# Patient Record
Sex: Female | Born: 1990 | Race: White | Hispanic: No | Marital: Single | Smoking: Former smoker
Health system: Southern US, Community
[De-identification: ages and names within clinical notes are randomized; demographics above are authoritative.]

## PROBLEM LIST (undated history)

## (undated) DIAGNOSIS — M1990 Unspecified osteoarthritis, unspecified site: Secondary | ICD-10-CM

## (undated) DIAGNOSIS — T7840XA Allergy, unspecified, initial encounter: Secondary | ICD-10-CM

## (undated) DIAGNOSIS — G4730 Sleep apnea, unspecified: Secondary | ICD-10-CM

## (undated) DIAGNOSIS — J45909 Unspecified asthma, uncomplicated: Secondary | ICD-10-CM

## (undated) DIAGNOSIS — F419 Anxiety disorder, unspecified: Secondary | ICD-10-CM

## (undated) DIAGNOSIS — F4310 Post-traumatic stress disorder, unspecified: Secondary | ICD-10-CM

## (undated) DIAGNOSIS — E039 Hypothyroidism, unspecified: Secondary | ICD-10-CM

## (undated) DIAGNOSIS — M419 Scoliosis, unspecified: Secondary | ICD-10-CM

## (undated) DIAGNOSIS — E662 Morbid (severe) obesity with alveolar hypoventilation: Secondary | ICD-10-CM

## (undated) DIAGNOSIS — F32A Depression, unspecified: Secondary | ICD-10-CM

## (undated) DIAGNOSIS — G4733 Obstructive sleep apnea (adult) (pediatric): Secondary | ICD-10-CM

## (undated) DIAGNOSIS — I517 Cardiomegaly: Secondary | ICD-10-CM

## (undated) DIAGNOSIS — F319 Bipolar disorder, unspecified: Secondary | ICD-10-CM

## (undated) DIAGNOSIS — R400 Somnolence: Secondary | ICD-10-CM

## (undated) DIAGNOSIS — Z21 Asymptomatic human immunodeficiency virus [HIV] infection status: Secondary | ICD-10-CM

## (undated) DIAGNOSIS — B20 Human immunodeficiency virus [HIV] disease: Secondary | ICD-10-CM

## (undated) HISTORY — DX: Depression, unspecified: F32.A

## (undated) HISTORY — DX: Scoliosis, unspecified: M41.9

## (undated) HISTORY — DX: Morbid (severe) obesity with alveolar hypoventilation: E66.2

## (undated) HISTORY — DX: Cardiomegaly: I51.7

## (undated) HISTORY — DX: Obstructive sleep apnea (adult) (pediatric): G47.33

## (undated) HISTORY — DX: Anxiety disorder, unspecified: F41.9

## (undated) HISTORY — DX: Sleep apnea, unspecified: G47.30

## (undated) HISTORY — DX: Human immunodeficiency virus (HIV) disease: B20

## (undated) HISTORY — DX: Somnolence: R40.0

## (undated) HISTORY — PX: CHOLECYSTECTOMY: SHX55

## (undated) HISTORY — DX: Allergy, unspecified, initial encounter: T78.40XA

## (undated) HISTORY — DX: Asymptomatic human immunodeficiency virus (hiv) infection status: Z21

## (undated) HISTORY — PX: NO PAST SURGERIES: SHX2092

---

## 2011-01-04 ENCOUNTER — Emergency Department: Payer: Self-pay | Admitting: Emergency Medicine

## 2012-07-24 ENCOUNTER — Emergency Department: Payer: Self-pay | Admitting: Emergency Medicine

## 2012-09-01 ENCOUNTER — Emergency Department: Payer: Self-pay | Admitting: Emergency Medicine

## 2012-09-01 LAB — COMPREHENSIVE METABOLIC PANEL
Anion Gap: 7 (ref 7–16)
BUN: 13 mg/dL (ref 7–18)
Chloride: 107 mmol/L (ref 98–107)
EGFR (African American): >60
Glucose: 81 mg/dL (ref 65–99)
Potassium: 4.7 mmol/L (ref 3.5–5.1)
SGOT(AST): 41 U/L — ABNORMAL HIGH (ref 15–37)
Sodium: 141 mmol/L (ref 136–145)

## 2012-09-01 LAB — URINALYSIS, COMPLETE
Blood: NEGATIVE
Leukocyte Esterase: NEGATIVE
Ph: 5 (ref 4.5–8.0)
Protein: NEGATIVE
RBC,UR: <1 /HPF (ref 0–5)
Squamous Epithelial: 2

## 2012-09-01 LAB — CBC
HCT: 44.2 % (ref 35.0–47.0)
MCH: 31.5 pg (ref 26.0–34.0)
MCHC: 34.5 g/dL (ref 32.0–36.0)
MCV: 91 fL (ref 80–100)
Platelet: 276 10*3/uL (ref 150–440)
RDW: 12.8 % (ref 11.5–14.5)

## 2012-09-30 ENCOUNTER — Ambulatory Visit: Payer: Self-pay

## 2017-01-03 DIAGNOSIS — F39 Unspecified mood [affective] disorder: Secondary | ICD-10-CM

## 2017-01-03 HISTORY — DX: Unspecified mood (affective) disorder: F39

## 2019-01-20 ENCOUNTER — Other Ambulatory Visit: Payer: Self-pay

## 2019-01-20 ENCOUNTER — Emergency Department (HOSPITAL_COMMUNITY): Payer: Self-pay

## 2019-01-20 ENCOUNTER — Encounter (HOSPITAL_COMMUNITY): Payer: Self-pay

## 2019-01-20 ENCOUNTER — Emergency Department (HOSPITAL_COMMUNITY)
Admission: EM | Admit: 2019-01-20 | Discharge: 2019-01-20 | Disposition: A | Discharge status: Home / Self Care | Payer: Self-pay | Attending: Emergency Medicine | Admitting: Emergency Medicine

## 2019-01-20 DIAGNOSIS — J40 Bronchitis, not specified as acute or chronic: Secondary | ICD-10-CM | POA: Insufficient documentation

## 2019-01-20 DIAGNOSIS — R05 Cough: Secondary | ICD-10-CM

## 2019-01-20 DIAGNOSIS — R059 Cough, unspecified: Secondary | ICD-10-CM

## 2019-01-20 HISTORY — DX: Post-traumatic stress disorder, unspecified: F43.10

## 2019-01-20 HISTORY — DX: Unspecified osteoarthritis, unspecified site: M19.90

## 2019-01-20 HISTORY — DX: Unspecified asthma, uncomplicated: J45.909

## 2019-01-20 LAB — POC URINE PREG, ED: Preg Test, Ur: NEGATIVE

## 2019-01-20 LAB — GROUP A STREP BY PCR: Group A Strep by PCR: NOT DETECTED

## 2019-01-20 MED ORDER — ALBUTEROL SULFATE (2.5 MG/3ML) 0.083% IN NEBU
5.0000 mg | INHALATION_SOLUTION | Freq: Once | RESPIRATORY_TRACT | Status: AC
  Administered 2019-01-20: 5 mg via RESPIRATORY_TRACT
  Filled 2019-01-20: qty 6

## 2019-01-20 MED ORDER — PREDNISONE 20 MG PO TABS: ORAL_TABLET | ORAL | 0 refills | Status: DC

## 2019-01-20 MED ORDER — BENZONATATE 100 MG PO CAPS: 100.0000 mg | ORAL_CAPSULE | Freq: Three times a day (TID) | ORAL | 0 refills | Status: DC

## 2019-01-20 NOTE — ED Notes (Signed)
Patient verbalizes understanding of medications and discharge instructions. No further questions at this time. VSS and patient ambulatory at discharge.   

## 2019-01-20 NOTE — ED Triage Notes (Signed)
Pt here with a sore throat and cough for the last 3 days. Kids at home tested positive for the flu and strep throat.  A&Ox4, afebrile.

## 2019-01-20 NOTE — ED Provider Notes (Signed)
[facility] EMERGENCY DEPARTMENT Provider Note   CSN: [id number] Arrival date & time: 01/20/19  0038    History   Chief Complaint Chief Complaint  Patient presents with  . Cough  . Sore Throat    HPI Brenda Sawyer is a 28 y.o. female.     The history is provided by the patient and medical records.  Cough  Associated symptoms: fever, sore throat and wheezing   Sore Throat     28 y.o. F with hx of asthma, arthritis, PTSD, presenting to the ED for sore throat, cough, and flu like symptoms.  States she has been sick for 3 days.  She reports productive cough with green phlegm, wheezing, body aches, nasal congestion, and sore throat.  Also reports fevers, afebrile here.  States children have been sick with flu and strep.  Has taken OTC cold meds without relief.  Patient also had a boil on left medial thigh that she popped a week ago.  States it feels sore and would like it checked.  No bleeding or drainage currently.  No hx of MRSA or DM.  Past Medical History:  Diagnosis Date  . Arthritis   . Asthma   . PTSD (post-traumatic stress disorder)     There are no active problems to display for this patient.   History reviewed. No pertinent surgical history.   OB History   No obstetric history on file.      Home Medications    Prior to Admission medications   Not on File    Family History History reviewed. No pertinent family history.  Social History Social History   Tobacco Use  . Smoking status: Never Smoker  . Smokeless tobacco: Never Used  Substance Use Topics  . Alcohol use: Never    Frequency: Never  . Drug use: Never     Allergies   Patient has no known allergies.   Review of Systems Review of Systems  Constitutional: Positive for fever.  HENT: Positive for congestion and sore throat.   Respiratory: Positive for cough and wheezing.   Skin: Positive for wound (boil).  All other systems reviewed and are negative.    Physical  Exam Updated Vital Signs BP 123/72 (BP Location: Right Arm)   Pulse 73   Temp 98.3 F (36.8 C) (Oral)   Resp 18   SpO2 97%   Physical Exam Vitals signs and nursing note reviewed.  Constitutional:      Appearance: She is well-developed.  HENT:     Head: Normocephalic and atraumatic.     Right Ear: Tympanic membrane and ear canal normal.     Left Ear: Tympanic membrane and ear canal normal.     Nose: Congestion present.     Mouth/Throat:     Lips: Pink.     Mouth: Mucous membranes are moist.     Pharynx: Oropharynx is clear.     Comments: PND; Tonsils overall normal in appearance bilaterally without exudate; uvula midline without evidence of peritonsillar abscess; handling secretions appropriately; no difficulty swallowing or speaking; normal phonation without stridor Eyes:     Conjunctiva/sclera: Conjunctivae normal.     Pupils: Pupils are equal, round, and reactive to light.  Neck:     Musculoskeletal: Normal range of motion.  Cardiovascular:     Rate and Rhythm: Normal rate and regular rhythm.     Heart sounds: Normal heart sounds.  Pulmonary:     Effort: Pulmonary effort is normal.  Breath sounds: Wheezing present.     Comments: Diffuse expiratory wheezes, dry cough witnessed, no acute distress Abdominal:     General: Bowel sounds are normal.     Palpations: Abdomen is soft.  Musculoskeletal: Normal range of motion.     Comments: Scabbed area noted to left medial thigh; no signs of superimposed infection or cellulitis; no streaking of the leg; no tissue crepitus  Skin:    General: Skin is warm and dry.  Neurological:     Mental Status: She is alert and oriented to person, place, and time.      ED Treatments / Results  Labs (all labs ordered are listed, but only abnormal results are displayed) Labs Reviewed  GROUP A STREP BY PCR  POC URINE PREG, ED    EKG None  Radiology Dg Chest 2 View  Result Date: 01/20/2019 CLINICAL DATA:  Cough and fever EXAM:  CHEST - 2 VIEW COMPARISON:  None. FINDINGS: Central airway thickening. There is no edema, consolidation, effusion, or pneumothorax. Normal heart size and mediastinal contours. IMPRESSION: Probable bronchitic airway thickening.  Negative for pneumonia. Electronically Signed   By: Marnee Spring M.D.   On: 01/20/2019 06:24    Procedures Procedures (including critical care time)  Medications Ordered in ED Medications  albuterol (PROVENTIL) (2.5 MG/3ML) 0.083% nebulizer solution 5 mg (5 mg Nebulization Given 01/20/19 0446)     Initial Impression / Assessment and Plan / ED Course  I have reviewed the triage vital signs and the nursing notes.  Pertinent labs & imaging results that were available during my care of the patient were reviewed by me and considered in my medical decision making (see chart for details).  28 y.o. F here with sore throat and flu like symptoms for 3 days.  Kids have been sick with sore throat and flu.  She is afebrile, non-toxic.  Exam with nasal congestion and PND but no tonsillar edema or exudates.  Lungs with some wheezes noted but no acute distress.  Rapid strep negative.  CXR with some bronchitic changes but no acute infiltrate.  Feel this is likely viral process.  Will start prednisone taper, tessalon for cough.  Continue albuterol inhaler PRN.    Also with boil of left medial thigh.  She popped this last week but feels sore.  Scab noted at area of concern, but no signs of acute abscess or cellulitis.  Warm compresses.  Close follow-up with PCP.  Return here for any new/acute changes.  Final Clinical Impressions(s) / ED Diagnoses   Final diagnoses:  Bronchitis  Cough    ED Discharge Orders         Ordered    benzonatate (TESSALON) 100 MG capsule  Every 8 hours     01/20/19 0644    predniSONE (DELTASONE) 20 MG tablet     01/20/19 0644           Garlon Hatchet, PA-C 01/20/19 0651    Ward, Layla Maw, DO 01/20/19 0710

## 2019-01-20 NOTE — Discharge Instructions (Signed)
Take the prescribed medication as directed.  Can use albuterol. Follow-up with your primary care doctor. Return to the ED for new or worsening symptoms.

## 2019-05-20 ENCOUNTER — Other Ambulatory Visit: Payer: Self-pay

## 2019-05-20 ENCOUNTER — Encounter (HOSPITAL_COMMUNITY): Payer: Self-pay | Admitting: *Deleted

## 2019-05-20 ENCOUNTER — Inpatient Hospital Stay (HOSPITAL_COMMUNITY)
Admission: EM | Admit: 2019-05-20 | Discharge: 2019-05-20 | Disposition: A | Discharge status: Home / Self Care | Payer: Medicaid Other | Attending: Obstetrics and Gynecology | Admitting: Obstetrics and Gynecology

## 2019-05-20 ENCOUNTER — Inpatient Hospital Stay (HOSPITAL_COMMUNITY): Payer: Medicaid Other

## 2019-05-20 DIAGNOSIS — R109 Unspecified abdominal pain: Secondary | ICD-10-CM | POA: Insufficient documentation

## 2019-05-20 DIAGNOSIS — O26891 Other specified pregnancy related conditions, first trimester: Secondary | ICD-10-CM | POA: Diagnosis not present

## 2019-05-20 DIAGNOSIS — R197 Diarrhea, unspecified: Secondary | ICD-10-CM

## 2019-05-20 DIAGNOSIS — O3680X0 Pregnancy with inconclusive fetal viability, not applicable or unspecified: Secondary | ICD-10-CM

## 2019-05-20 DIAGNOSIS — Z6790 Unspecified blood type, Rh positive: Secondary | ICD-10-CM

## 2019-05-20 DIAGNOSIS — O9989 Other specified diseases and conditions complicating pregnancy, childbirth and the puerperium: Secondary | ICD-10-CM | POA: Insufficient documentation

## 2019-05-20 DIAGNOSIS — R102 Pelvic and perineal pain: Secondary | ICD-10-CM | POA: Insufficient documentation

## 2019-05-20 DIAGNOSIS — Z3A01 Less than 8 weeks gestation of pregnancy: Secondary | ICD-10-CM | POA: Diagnosis not present

## 2019-05-20 DIAGNOSIS — O26899 Other specified pregnancy related conditions, unspecified trimester: Secondary | ICD-10-CM

## 2019-05-20 HISTORY — DX: Hypothyroidism, unspecified: E03.9

## 2019-05-20 LAB — CBC
HCT: 41.4 % (ref 36.0–46.0)
Hemoglobin: 14.4 g/dL (ref 12.0–15.0)
MCH: 31.3 pg (ref 26.0–34.0)
MCHC: 34.8 g/dL (ref 30.0–36.0)
MCV: 90 fL (ref 80.0–100.0)
Platelets: 265 10*3/uL (ref 150–400)
RBC: 4.6 MIL/uL (ref 3.87–5.11)
RDW: 12 % (ref 11.5–15.5)
WBC: 10.2 10*3/uL (ref 4.0–10.5)
nRBC: 0 % (ref 0.0–0.2)

## 2019-05-20 LAB — WET PREP, GENITAL
Clue Cells Wet Prep HPF POC: NONE SEEN
Sperm: NONE SEEN
Trich, Wet Prep: NONE SEEN
Yeast Wet Prep HPF POC: NONE SEEN

## 2019-05-20 LAB — URINALYSIS, ROUTINE W REFLEX MICROSCOPIC
Bilirubin Urine: NEGATIVE
Glucose, UA: NEGATIVE mg/dL
Hgb urine dipstick: NEGATIVE
Ketones, ur: NEGATIVE mg/dL
Leukocytes,Ua: NEGATIVE
Nitrite: NEGATIVE
Protein, ur: NEGATIVE mg/dL
Specific Gravity, Urine: 1.02 (ref 1.005–1.030)
pH: 6 (ref 5.0–8.0)

## 2019-05-20 LAB — COMPREHENSIVE METABOLIC PANEL
ALT: 38 U/L (ref 0–44)
AST: 35 U/L (ref 15–41)
Albumin: 3.5 g/dL (ref 3.5–5.0)
Alkaline Phosphatase: 53 U/L (ref 38–126)
Anion gap: 10 (ref 5–15)
BUN: 11 mg/dL (ref 6–20)
CO2: 23 mmol/L (ref 22–32)
Calcium: 9.1 mg/dL (ref 8.9–10.3)
Chloride: 103 mmol/L (ref 98–111)
Creatinine, Ser: 0.85 mg/dL (ref 0.44–1.00)
GFR calc Af Amer: >60 mL/min (ref >60)
GFR calc non Af Amer: >60 mL/min (ref >60)
Glucose, Bld: 92 mg/dL (ref 70–99)
Potassium: 3.8 mmol/L (ref 3.5–5.1)
Sodium: 136 mmol/L (ref 135–145)
Total Bilirubin: 0.7 mg/dL (ref 0.3–1.2)
Total Protein: 7 g/dL (ref 6.5–8.1)

## 2019-05-20 LAB — ABO/RH: ABO/RH(D): B POS

## 2019-05-20 LAB — HCG, QUANTITATIVE, PREGNANCY: hCG, Beta Chain, Quant, S: 157 m[IU]/mL — ABNORMAL HIGH (ref <5)

## 2019-05-20 LAB — POCT PREGNANCY, URINE: Preg Test, Ur: POSITIVE — AB

## 2019-05-20 NOTE — MAU Note (Signed)
+  HPT x6.  Having cramping in lower abd, started 3 days ago.  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  Her place of employment has been tested, but they have not gotten the results yet.  Pt states is having diarrhea, started 3 days ago as loose and watery, now is just soft. (6/in the last 24 hrs)

## 2019-05-20 NOTE — MAU Provider Note (Signed)
History     CSN: [id number]  Arrival date and time: 05/20/19 3500   First Provider Initiated Contact with Patient 05/20/19 1044      Chief Complaint  Patient presents with  . Abdominal Pain  . Possible Pregnancy  . Diarrhea   Ms. Brenda Sawyer is a 28 y.o. G1P0 at [redacted]w[redacted]d who presents to MAU for abdominal cramping. 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 Pt reports she has had diarrhea x3days, stools were loose/watery, now are formed, but soft. Pt reports diarrhea is improving. Pt has gone to bathroom 6times in past 24hrs.  Onset: 3days Location: lower abdomen/pelvis Duration: 3days Character: intermittent, sharp, stabbing Aggravating/Associated: none/slight fish-like odor to vaginal discharge Relieving: none Treatment: Tylenol - helped relieve pain  Pt denies VB, vaginal discharge/itching. Pt denies N/V, constipation, or urinary problems. Pt denies fever, chills, fatigue, sweating or changes in appetite. Pt denies SOB or chest pain. Pt denies dizziness, HA, light-headedness, weakness.  Problems this pregnancy include: pt has not yet been seen. Allergies? Sulfa ABX, artificial cinnamon Current medications/supplements? Levothyroxine, PNVs   OB History    Gravida  1   Para      Term      Preterm      AB      Living        SAB      TAB      Ectopic      Multiple      Live Births              Past Medical History:  Diagnosis Date  . Asthma   . Hypothyroidism   . PTSD (post-traumatic stress disorder)     History reviewed. No pertinent surgical history.  History reviewed. No pertinent family history.  Social History   Tobacco Use  . Smoking status: Never Smoker  . Smokeless tobacco: Never Used  Substance Use Topics  . Alcohol use: Never    Frequency: Never  . Drug use: Never    Allergies:  Allergies  Allergen Reactions  . Sulfa Antibiotics Hives    Medications Prior to Admission  Medication  Sig Dispense Refill Last Dose  . levofloxacin (LEVAQUIN) 25 MG/ML solution Take 137 mg by mouth daily.   05/20/2019 at Unknown time  . Prenatal Vit-Fe Fumarate-FA (PRENATAL MULTIVITAMIN) TABS tablet Take 1 tablet by mouth daily at 12 noon.   05/20/2019 at Unknown time  . benzonatate (TESSALON) 100 MG capsule Take 1 capsule (100 mg total) by mouth every 8 (eight) hours. 21 capsule 0   . predniSONE (DELTASONE) 20 MG tablet Take 40 mg by mouth daily for 3 days, then 20mg  by mouth daily for 3 days, then 10mg  daily for 3 days 12 tablet 0     Review of Systems  Constitutional: Negative for chills, diaphoresis, fatigue and fever.  Respiratory: Negative for shortness of breath.   Cardiovascular: Negative for chest pain.  Gastrointestinal: Positive for abdominal pain and diarrhea. Negative for constipation, nausea and vomiting.  Genitourinary: Positive for pelvic pain. Negative for dysuria, flank pain, frequency, urgency, vaginal bleeding and vaginal discharge.  Neurological: Negative for dizziness, weakness, light-headedness and headaches.   Physical Exam   Blood pressure 123/82, pulse 76, temperature 98.1 F (36.7 C), temperature source Oral, resp. rate 18, height 5\' 8"  (1.727 m), weight (!) 141.2 kg, last menstrual period 04/16/2019, SpO2 99 %.  Patient Vitals for the past 24 hrs:  BP Temp Temp src Pulse Resp  SpO2 Height Weight  05/20/19 1000 123/82 98.1 F (36.7 C) Oral 76 18 99 % 5\' 8"  (1.727 m) (!) 141.2 kg   Physical Exam  Constitutional: She is oriented to person, place, and time. She appears well-developed and well-nourished. No distress.  HENT:  Head: Normocephalic and atraumatic.  Respiratory: Effort normal.  GI: Soft. She exhibits no distension and no mass. There is no abdominal tenderness. There is no rebound and no guarding.  Genitourinary: There is no rash, tenderness or lesion on the right labia. There is no rash, tenderness or lesion on the left labia. Uterus is not enlarged and  not tender. Cervix exhibits no motion tenderness, no discharge and no friability. Right adnexum displays no mass, no tenderness and no fullness. Left adnexum displays no mass, no tenderness and no fullness.    Vaginal discharge (scant, white, odorless) present.     No vaginal tenderness or bleeding.  No tenderness or bleeding in the vagina.  Neurological: She is alert and oriented to person, place, and time.  Skin: Skin is warm and dry. She is not diaphoretic.  Psychiatric: She has a normal mood and affect. Her behavior is normal. Judgment and thought content normal.   Results for orders placed or performed during the hospital encounter of 05/20/19 (from the past 24 hour(s))  Pregnancy, urine POC     Status: Abnormal   Collection Time: 05/20/19 10:07 AM  Result Value Ref Range   Preg Test, Ur POSITIVE (A) NEGATIVE  Urinalysis, Routine w reflex microscopic     Status: None   Collection Time: 05/20/19 10:10 AM  Result Value Ref Range   Color, Urine YELLOW YELLOW   APPearance CLEAR CLEAR   Specific Gravity, Urine 1.020 1.005 - 1.030   pH 6.0 5.0 - 8.0   Glucose, UA NEGATIVE NEGATIVE mg/dL   Hgb urine dipstick NEGATIVE NEGATIVE   Bilirubin Urine NEGATIVE NEGATIVE   Ketones, ur NEGATIVE NEGATIVE mg/dL   Protein, ur NEGATIVE NEGATIVE mg/dL   Nitrite NEGATIVE NEGATIVE   Leukocytes,Ua NEGATIVE NEGATIVE  CBC     Status: None   Collection Time: 05/20/19 10:54 AM  Result Value Ref Range   WBC 10.2 4.0 - 10.5 K/uL   RBC 4.60 3.87 - 5.11 MIL/uL   Hemoglobin 14.4 12.0 - 15.0 g/dL   HCT 16.141.4 09.636.0 - 04.546.0 %   MCV 90.0 80.0 - 100.0 fL   MCH 31.3 26.0 - 34.0 pg   MCHC 34.8 30.0 - 36.0 g/dL   RDW 40.912.0 81.111.5 - 91.415.5 %   Platelets 265 150 - 400 K/uL   nRBC 0.0 0.0 - 0.2 %  hCG, quantitative, pregnancy     Status: Abnormal   Collection Time: 05/20/19 10:54 AM  Result Value Ref Range   hCG, Beta Chain, Quant, S 157 (H) <5 mIU/mL  ABO/Rh     Status: None   Collection Time: 05/20/19 10:54 AM  Result  Value Ref Range   ABO/RH(D) B POS    No rh immune globuloin      NOT A RH IMMUNE GLOBULIN CANDIDATE, PT RH POSITIVE Performed at [facility], [address] [place], [place] [address]   Comprehensive metabolic panel     Status: None   Collection Time: 05/20/19 10:54 AM  Result Value Ref Range   Sodium 136 135 - 145 mmol/L   Potassium 3.8 3.5 - 5.1 mmol/L   Chloride 103 98 - 111 mmol/L   CO2 23 22 - 32 mmol/L  Glucose, Bld 92 70 - 99 mg/dL   BUN 11 6 - 20 mg/dL   Creatinine, Ser 1.610.85 0.44 - 1.00 mg/dL   Calcium 9.1 8.9 - 09.610.3 mg/dL   Total Protein 7.0 6.5 - 8.1 g/dL   Albumin 3.5 3.5 - 5.0 g/dL   AST 35 15 - 41 U/L   ALT 38 0 - 44 U/L   Alkaline Phosphatase 53 38 - 126 U/L   Total Bilirubin 0.7 0.3 - 1.2 mg/dL   GFR calc non Af Amer >60 >60 mL/min   GFR calc Af Amer >60 >60 mL/min   Anion gap 10 5 - 15  Wet prep, genital     Status: Abnormal   Collection Time: 05/20/19 11:05 AM   Specimen: Cervical/Vaginal swab  Result Value Ref Range   Yeast Wet Prep HPF POC NONE SEEN NONE SEEN   Trich, Wet Prep NONE SEEN NONE SEEN   Clue Cells Wet Prep HPF POC NONE SEEN NONE SEEN   WBC, Wet Prep HPF POC MODERATE (A) NONE SEEN   Sperm NONE SEEN    [place] Ob Less Than 14 Weeks With Ob Transvaginal  Result Date: 05/20/2019 CLINICAL DATA:  Pelvic pain in first trimester of pregnancy EXAM: OBSTETRIC <14 WK US AND TRANSVAGINAL OB US TECHNIQUE: Both transabdominal and transvaginal ultrasound examinations were performed for complete evaluation of the gestation as well as the maternal uterus, adnexal regions, and pelvic cul-de-sac. Transvaginal technique was performed to assess early pregnancy. COMPARISON:  None. FINDINGS: Intrauterine gestational sac: None identified Yolk sac:  N/A Embryo:  N/A Cardiac Activity: N/A Heart Rate: N/A  bpm MSD:   mm    w     d CRL:    mm    w    d                  US EDC: Subchorionic hemorrhage:  N/A Maternal uterus/adnexae: Uterus anteverted, normal morphology. No  uterine mass identified. Endometrial complex unremarkable without fluid or gestational sac. RIGHT ovary measures 4.2 x 2.9 x 3.1 cm and contains a small corpus luteum. LEFT ovary normal size and morphology 2.6 x 1.3 x 1.4 cm. No free pelvic fluid or adnexal masses. IMPRESSION: No intrauterine gestation identified. Findings are compatible with pregnancy of unknown location. Differential diagnosis includes early intrauterine pregnancy too early to visualize, spontaneous abortion, and ectopic pregnancy. Serial quantitative beta hCG and or followup ultrasound recommended to definitively exclude ectopic pregnancy. Electronically Signed   By: Ulyses SouthwardMark  Boles M.D.   On: 05/20/2019 11:51    MAU Course  Procedures  MDM -diarrhea x3days with r/o ectopic -UA: WNL -CBC: WNL -CMP: WNL (K 3.8) -US: no IUP, CL on right ovary -hCG: 157 -ABO: B Positive -WetPrep: mod WBCs, otherwise WNL -GC/CT collected -pregnancy of unknown location, will repeat quant Friday AM -pt discharged to home in stable condition  Orders Placed This Encounter  Procedures  . Wet prep, genital    Standing Status:   Standing    Number of Occurrences:   1  . US OB LESS THAN 14 WEEKS WITH OB TRANSVAGINAL    Standing Status:   Standing    Number of Occurrences:   1    Order Specific Question:   Symptom/Reason for Exam    Answer:   Pelvic pain in pregnancy [[id number]][[id number]]  . Urinalysis, Routine w reflex microscopic    Standing Status:   Standing    Number of Occurrences:   1  . CBC  Standing Status:   Standing    Number of Occurrences:   1  . hCG, quantitative, pregnancy    Standing Status:   Standing    Number of Occurrences:   1  . Comprehensive metabolic panel    Standing Status:   Standing    Number of Occurrences:   1  . Contact and Enteric precautions (UV disinfection)    Standing Status:   Standing    Number of Occurrences:   1  . Pregnancy, urine POC    Standing Status:   Standing    Number of Occurrences:   1  . ABO/Rh     Standing Status:   Standing    Number of Occurrences:   1  . Discharge patient    Order Specific Question:   Discharge disposition    Answer:   01-Home or Self Care [1]    Order Specific Question:   Discharge patient date    Answer:   05/20/2019   No orders of the defined types were placed in this encounter.  Assessment and Plan   1. Pregnancy of unknown anatomic location   2. Pelvic pain in pregnancy   3. Blood type, Rh positive   4. Diarrhea, unspecified type    Allergies as of 05/20/2019      Reactions   Sulfa Antibiotics Hives      Medication List    TAKE these medications   benzonatate 100 MG capsule Commonly known as: TESSALON Take 1 capsule (100 mg total) by mouth every 8 (eight) hours.   levofloxacin 25 MG/ML solution Commonly known as: LEVAQUIN Take 137 mg by mouth daily.   predniSONE 20 MG tablet Commonly known as: DELTASONE Take 40 mg by mouth daily for 3 days, then 20mg  by mouth daily for 3 days, then 10mg  daily for 3 days   prenatal multivitamin Tabs tablet Take 1 tablet by mouth daily at 12 noon.      -list of OB providers given -will call with culture results, if positive -list of safe meds in pregnancy given with focus on anti-diarrheals -return to MAU Friday AM 05/22/2019 for repeat hCG (clinic closed for July 4th holiday) -strict ectopic/bleeding/return MAU precautions given -pt discharged to home in stable condition  Joni Reiningicole E Mahli Glahn 05/20/2019, 1:41 PM

## 2019-05-20 NOTE — Discharge Instructions (Signed)
Safe Medications in Pregnancy    Acne: Benzoyl Peroxide Salicylic Acid  Backache/Headache: Tylenol: 2 regular strength every 4 hours OR              2 Extra strength every 6 hours  Colds/Coughs/Allergies: Benadryl (alcohol free) 25 mg every 6 hours as needed Breath right strips Claritin Cepacol throat lozenges Chloraseptic throat spray Cold-Eeze- up to three times per day Cough drops, alcohol free Flonase (by prescription only) Guaifenesin Mucinex Robitussin DM (plain only, alcohol free) Saline nasal spray/drops Sudafed (pseudoephedrine) & Actifed ** use only after [redacted] weeks gestation and if you do not have high blood pressure Tylenol Vicks Vaporub Zinc lozenges Zyrtec   Constipation: Colace Ducolax suppositories Fleet enema Glycerin suppositories Metamucil Milk of magnesia Miralax Senokot Smooth move tea  Diarrhea: Kaopectate Imodium A-D  *NO pepto Bismol  Hemorrhoids: Anusol Anusol HC Preparation H Tucks  Indigestion: Tums Maalox Mylanta Zantac  Pepcid  Insomnia: Benadryl (alcohol free) 25mg  every 6 hours as needed Tylenol PM Unisom, no Gelcaps  Leg Cramps: Tums MagGel  Nausea/Vomiting:  Bonine Dramamine Emetrol Ginger extract Sea bands Meclizine  Nausea medication to take during pregnancy:  Unisom (doxylamine succinate 25 mg tablets) Take one tablet daily at bedtime. If symptoms are not adequately controlled, the dose can be increased to a maximum recommended dose of two tablets daily (1/2 tablet in the morning, 1/2 tablet mid-afternoon and one at bedtime). Vitamin B6 100mg  tablets. Take one tablet twice a day (up to 200 mg per day).  Skin Rashes: Aveeno products Benadryl cream or 25mg  every 6 hours as needed Calamine Lotion 1% cortisone cream  Yeast infection: Gyne-lotrimin 7 Monistat 7   **If taking multiple medications, please check labels to avoid duplicating the same active ingredients **take  medication as directed on the label ** Do not exceed 4000 mg of tylenol in 24 hours **Do not take medications that contain aspirin or ibuprofen  [facility]     Phone: [phone]  [facility] for [employer] at [place]  Phone: [place] for [employer] at [place]  Phone: [place] for [place] at [place]                           Phone: [place] for [place] at [facility]          Phone: [phone]  [facility]    Phone: [phone]   [facility] [place])    Phone: [facility]    Phone: [phone]  [facility]    Phone: [phone]  [place]    Phone: [phone]  [place] [facility]  Phone: [place]               Phone: [phone]  [facility]   Phone: [phone]  [facility]    Phone: [phone]                  Abdominal Pain During Pregnancy  Abdominal pain is common during pregnancy, and has many possible causes. Some causes are more serious than others, and sometimes the cause is not known. Abdominal pain can be a sign that labor is starting. It can also be caused by normal growth and stretching of muscles and ligaments during pregnancy. Always tell your health  care provider if you have any abdominal pain. Follow these instructions at home:  Do not have sex or put anything in your vagina until your pain goes away completely.  Get plenty of rest until your pain improves.  Drink enough fluid to keep your urine pale yellow.  Take over-the-counter and prescription medicines only as told by your health care provider.  Keep all follow-up visits as told by your health care provider. This is  important. Contact a health care provider if:  Your pain continues or gets worse after resting.  You have lower abdominal pain that: ? Comes and goes at regular intervals. ? Spreads to your back. ? Is similar to menstrual cramps.  You have pain or burning when you urinate. Get help right away if:  You have a fever or chills.  You have vaginal bleeding.  You are leaking fluid from your vagina.  You are passing tissue from your vagina.  You have vomiting or diarrhea that lasts for more than 24 hours.  Your baby is moving less than usual.  You feel very weak or faint.  You have shortness of breath.  You develop severe pain in your upper abdomen. Summary  Abdominal pain is common during pregnancy, and has many possible causes.  If you experience abdominal pain during pregnancy, tell your health care provider right away.  Follow your health care provider's home care instructions and keep all follow-up visits as directed. This information is not intended to replace advice given to you by your health care provider. Make sure you discuss any questions you have with your health care provider. Document Released: 11/05/2005 Document Revised: 02/23/2019 Document Reviewed: 02/07/2017 Elsevier Patient Education  2020 [employer]

## 2019-05-21 LAB — GC/CHLAMYDIA PROBE AMP (~~LOC~~) NOT AT ARMC
Chlamydia: NEGATIVE
Neisseria Gonorrhea: NEGATIVE

## 2019-05-22 ENCOUNTER — Inpatient Hospital Stay (HOSPITAL_COMMUNITY)
Admission: AD | Admit: 2019-05-22 | Discharge: 2019-05-22 | Disposition: A | Discharge status: Home / Self Care | Payer: Medicaid Other | Attending: Family Medicine | Admitting: Family Medicine

## 2019-05-22 ENCOUNTER — Other Ambulatory Visit: Payer: Self-pay

## 2019-05-22 DIAGNOSIS — O3680X0 Pregnancy with inconclusive fetal viability, not applicable or unspecified: Secondary | ICD-10-CM | POA: Diagnosis not present

## 2019-05-22 DIAGNOSIS — Z3A01 Less than 8 weeks gestation of pregnancy: Secondary | ICD-10-CM

## 2019-05-22 DIAGNOSIS — O26893 Other specified pregnancy related conditions, third trimester: Secondary | ICD-10-CM | POA: Diagnosis present

## 2019-05-22 LAB — HCG, QUANTITATIVE, PREGNANCY: hCG, Beta Chain, Quant, S: 433 m[IU]/mL — ABNORMAL HIGH (ref <5)

## 2019-05-22 NOTE — MAU Note (Signed)
Here for repeat blood work.  Denies bleeding.  States still has some random cramping, associated with diarrhea, small amounts.

## 2019-05-22 NOTE — MAU Provider Note (Addendum)
Brenda Sawyer  is a 28 y.o. G1P0 at [redacted]w[redacted]d who presents to MAU today for follow-up quant hCG after 48 hours. The patient was seen in MAU on 05/20/19 and had quant hCG of 157 and US showed no IUP or adnexal mass. She denies pain, vaginal bleeding or fever today.   OB History  Gravida Para Term Preterm AB Living  1            SAB TAB Ectopic Multiple Live Births               # Outcome Date GA Lbr Len/2nd Weight Sex Delivery Anes PTL Lv  1 Current             Past Medical History:  Diagnosis Date  . Asthma   . Hypothyroidism   . PTSD (post-traumatic stress disorder)    ROS: no VB no pain  BP 121/78 (BP Location: Right Arm)   Pulse 84   Temp 98.9 F (37.2 C) (Oral)   Resp 18   LMP 04/16/2019   SpO2 98%   CONSTITUTIONAL: Well-developed, well-nourished female in no acute distress.  MUSCULOSKELETAL: Normal range of motion.  CARDIOVASCULAR: Regular heart rate RESPIRATORY: Normal effort NEUROLOGICAL: Alert and oriented to person, place, and time.  SKIN: Not diaphoretic. No erythema. No pallor. PSYCH: Normal mood and affect. Normal behavior. Normal judgment and thought content.  Results for orders placed or performed during the hospital encounter of 05/22/19 (from the past 24 hour(s))  hCG, quantitative, pregnancy     Status: Abnormal   Collection Time: 05/22/19 12:19 PM  Result Value Ref Range   hCG, Beta Chain, Quant, S 433 (H) <5 mIU/mL    MDM: Good rise in [facility], will check one more level and if continues to rise appropriately then schedule f/u US 10-14 days. Stable for discharge home.   A: 1. Pregnancy, location unknown     P: Discharge home First trimester/ectopic precautions discussed Patient will return for follow-up quant HCG in [place] on 05/25/19 Patient may return to MAU as needed or if her condition were to change or worsen   Julianne Handler, [place] 05/22/2019 1:34 PM

## 2019-05-22 NOTE — Discharge Instructions (Signed)
Abdominal Pain During Pregnancy ° °Belly (abdominal) pain is common during pregnancy. There are many possible causes. Most of the time, it is not a serious problem. Other times, it can be a sign that something is wrong with the pregnancy. Always tell your doctor if you have belly pain. °Follow these instructions at home: °· Do not have sex or put anything in your vagina until your pain goes away completely. °· Get plenty of rest until your pain gets better. °· Drink enough fluid to keep your pee (urine) pale yellow. °· Take over-the-counter and prescription medicines only as told by your doctor. °· Keep all follow-up visits as told by your doctor. This is important. °Contact a doctor if: °· Your pain continues or gets worse after resting. °· You have lower belly pain that: °? Comes and goes at regular times. °? Spreads to your back. °? Feels like menstrual cramps. °· You have pain or burning when you pee (urinate). °Get help right away if: °· You have a fever or chills. °· You have vaginal bleeding. °· You are leaking fluid from your vagina. °· You are passing tissue from your vagina. °· You throw up (vomit) for more than 24 hours. °· You have watery poop (diarrhea) for more than 24 hours. °· Your baby is moving less than usual. °· You feel very weak or faint. °· You have shortness of breath. °· You have very bad pain in your upper belly. °Summary °· Belly (abdominal) pain is common during pregnancy. There are many possible causes. °· If you have belly pain during pregnancy, tell your doctor right away. °· Keep all follow-up visits as told by your doctor. This is important. °This information is not intended to replace advice given to you by your health care provider. Make sure you discuss any questions you have with your health care provider. °Document Released: 10/24/2009 Document Revised: 02/23/2019 Document Reviewed: 02/07/2017 °Elsevier Patient Education © 2020 [employer] Inc. ° °

## 2019-05-25 ENCOUNTER — Other Ambulatory Visit: Payer: Self-pay

## 2019-05-25 ENCOUNTER — Ambulatory Visit (INDEPENDENT_AMBULATORY_CARE_PROVIDER_SITE_OTHER): Payer: Self-pay | Admitting: General Practice

## 2019-05-25 ENCOUNTER — Telehealth: Payer: Self-pay | Admitting: General Practice

## 2019-05-25 DIAGNOSIS — O3680X0 Pregnancy with inconclusive fetal viability, not applicable or unspecified: Secondary | ICD-10-CM

## 2019-05-25 LAB — BETA HCG QUANT (REF LAB): hCG Quant: 1288 m[IU]/mL

## 2019-05-25 NOTE — Telephone Encounter (Signed)
Called patient & informed her of bhcg results and ultrasound appt. Ectopic precautions also reviewed. Patient verbalized understanding & had no questions.

## 2019-05-25 NOTE — Progress Notes (Signed)
Patient presents to office today for stat bhcg following recent MAU visit on 7/1 & 7/3. Patient reports continued diarrhea & stomach cramps- denies bleeding. Discussed with patient we are monitoring your bhcg levels today, results will be reviewed with a provider & we will call her with results/updated plan of care in a couple hours. Patient verbalized understanding & provided call back number [phone].  Reviewed results with Dr Hulan Fray who finds appropriate rise in bhcg levels- patient should have follow up ultrasound in 3 weeks. Scheduled 7/27@ 945. Will call patient with results.  Koren Bound RN BSN 05/25/19

## 2019-05-27 NOTE — Progress Notes (Signed)
I have reviewed this chart and agree with the RN/CMA assessment and management.    Hula Tasso C Khalessi Blough, MD, FACOG Attending Physician, [facility]  

## 2019-06-04 ENCOUNTER — Inpatient Hospital Stay (HOSPITAL_COMMUNITY)
Admission: AD | Admit: 2019-06-04 | Discharge: 2019-06-04 | Disposition: A | Discharge status: Home / Self Care | Payer: Medicaid Other | Attending: Obstetrics and Gynecology | Admitting: Obstetrics and Gynecology

## 2019-06-04 ENCOUNTER — Encounter (HOSPITAL_COMMUNITY): Payer: Self-pay | Admitting: *Deleted

## 2019-06-04 ENCOUNTER — Other Ambulatory Visit: Payer: Self-pay

## 2019-06-04 DIAGNOSIS — O99281 Endocrine, nutritional and metabolic diseases complicating pregnancy, first trimester: Secondary | ICD-10-CM | POA: Diagnosis not present

## 2019-06-04 DIAGNOSIS — O210 Mild hyperemesis gravidarum: Secondary | ICD-10-CM | POA: Insufficient documentation

## 2019-06-04 DIAGNOSIS — Z882 Allergy status to sulfonamides status: Secondary | ICD-10-CM | POA: Diagnosis not present

## 2019-06-04 DIAGNOSIS — F319 Bipolar disorder, unspecified: Secondary | ICD-10-CM | POA: Insufficient documentation

## 2019-06-04 DIAGNOSIS — O99611 Diseases of the digestive system complicating pregnancy, first trimester: Secondary | ICD-10-CM | POA: Diagnosis not present

## 2019-06-04 DIAGNOSIS — E039 Hypothyroidism, unspecified: Secondary | ICD-10-CM | POA: Insufficient documentation

## 2019-06-04 DIAGNOSIS — R197 Diarrhea, unspecified: Secondary | ICD-10-CM | POA: Diagnosis not present

## 2019-06-04 DIAGNOSIS — O26891 Other specified pregnancy related conditions, first trimester: Secondary | ICD-10-CM | POA: Insufficient documentation

## 2019-06-04 DIAGNOSIS — K219 Gastro-esophageal reflux disease without esophagitis: Secondary | ICD-10-CM | POA: Diagnosis not present

## 2019-06-04 DIAGNOSIS — O99341 Other mental disorders complicating pregnancy, first trimester: Secondary | ICD-10-CM | POA: Insufficient documentation

## 2019-06-04 DIAGNOSIS — Z3A01 Less than 8 weeks gestation of pregnancy: Secondary | ICD-10-CM | POA: Insufficient documentation

## 2019-06-04 HISTORY — DX: Bipolar disorder, unspecified: F31.9

## 2019-06-04 LAB — CBC
HCT: 42 % (ref 36.0–46.0)
Hemoglobin: 14.7 g/dL (ref 12.0–15.0)
MCH: 31.6 pg (ref 26.0–34.0)
MCHC: 35 g/dL (ref 30.0–36.0)
MCV: 90.3 fL (ref 80.0–100.0)
Platelets: 233 10*3/uL (ref 150–400)
RBC: 4.65 MIL/uL (ref 3.87–5.11)
RDW: 12.1 % (ref 11.5–15.5)
WBC: 10.8 10*3/uL — ABNORMAL HIGH (ref 4.0–10.5)
nRBC: 0 % (ref 0.0–0.2)

## 2019-06-04 LAB — COMPREHENSIVE METABOLIC PANEL
ALT: 56 U/L — ABNORMAL HIGH (ref 0–44)
AST: 48 U/L — ABNORMAL HIGH (ref 15–41)
Albumin: 3.6 g/dL (ref 3.5–5.0)
Alkaline Phosphatase: 54 U/L (ref 38–126)
Anion gap: 10 (ref 5–15)
BUN: 8 mg/dL (ref 6–20)
CO2: 23 mmol/L (ref 22–32)
Calcium: 9.1 mg/dL (ref 8.9–10.3)
Chloride: 103 mmol/L (ref 98–111)
Creatinine, Ser: 0.8 mg/dL (ref 0.44–1.00)
GFR calc Af Amer: >60 mL/min (ref >60)
GFR calc non Af Amer: >60 mL/min (ref >60)
Glucose, Bld: 91 mg/dL (ref 70–99)
Potassium: 4.2 mmol/L (ref 3.5–5.1)
Sodium: 136 mmol/L (ref 135–145)
Total Bilirubin: 1 mg/dL (ref 0.3–1.2)
Total Protein: 7.2 g/dL (ref 6.5–8.1)

## 2019-06-04 LAB — URINALYSIS, ROUTINE W REFLEX MICROSCOPIC
Bilirubin Urine: NEGATIVE
Glucose, UA: NEGATIVE mg/dL
Hgb urine dipstick: NEGATIVE
Ketones, ur: 20 mg/dL — AB
Leukocytes,Ua: NEGATIVE
Nitrite: NEGATIVE
Protein, ur: 30 mg/dL — AB
Specific Gravity, Urine: 1.025 (ref 1.005–1.030)
pH: 7 (ref 5.0–8.0)

## 2019-06-04 MED ORDER — LIDOCAINE VISCOUS HCL 2 % MT SOLN
15.0000 mL | Freq: Once | OROMUCOSAL | Status: AC
  Administered 2019-06-04: 15 mL via ORAL
  Filled 2019-06-04: qty 15

## 2019-06-04 MED ORDER — SCOPOLAMINE 1 MG/3DAYS TD PT72: 1.0000 | MEDICATED_PATCH | TRANSDERMAL | 12 refills | Status: DC

## 2019-06-04 MED ORDER — ALUM & MAG HYDROXIDE-SIMETH 200-200-20 MG/5ML PO SUSP
30.0000 mL | Freq: Once | ORAL | Status: AC
  Administered 2019-06-04: 30 mL via ORAL
  Filled 2019-06-04: qty 30

## 2019-06-04 MED ORDER — LACTATED RINGERS IV BOLUS
1000.0000 mL | Freq: Once | INTRAVENOUS | Status: AC
  Administered 2019-06-04: 18:00:00 1000 mL via INTRAVENOUS

## 2019-06-04 MED ORDER — LOPERAMIDE HCL 2 MG PO CAPS: 2.0000 mg | ORAL_CAPSULE | Freq: Four times a day (QID) | ORAL | 0 refills | Status: DC | PRN

## 2019-06-04 MED ORDER — ONDANSETRON 4 MG PO TBDP
8.0000 mg | ORAL_TABLET | Freq: Once | ORAL | Status: AC
  Administered 2019-06-04: 8 mg via ORAL
  Filled 2019-06-04: qty 2

## 2019-06-04 MED ORDER — ONDANSETRON 8 MG PO TBDP: 8.0000 mg | ORAL_TABLET | Freq: Three times a day (TID) | ORAL | 0 refills | Status: DC | PRN

## 2019-06-04 MED ORDER — CALCIUM POLYCARBOPHIL 625 MG PO TABS: 625.0000 mg | ORAL_TABLET | Freq: Every day | ORAL | 1 refills | Status: DC

## 2019-06-04 NOTE — MAU Note (Signed)
Still having diarrhea.  Has been going on for several wks now.  Has the nausea and vomiting on top of that from  The morning sickness. Can't keep anything down. Urine is getting dark and cloudy, believes she is dehydrated. "feels like shit"

## 2019-06-04 NOTE — MAU Provider Note (Addendum)
Patient Brenda Sawyer is a 28 y.o. G1P0 At 5332w0d here with complaints of vomiting and diarrhea.  She denies VB, abnormal vaginal discharge, fever, body aches, muscle aches and pains. She denies pain with urination or blood in her urine or stool.   She is very concerned about her on-going diarrhea; she states that it started on 6-26; a few days earlier she was notified that the water at her job may have been contaminated.   History     CSN: [id number]  Arrival date and time: 06/04/19 1530   First Provider Initiated Contact with Patient 06/04/19 1724      Chief Complaint  Patient presents with  . Nausea  . Emesis  . Diarrhea   Emesis  This is a new problem. The current episode started in the past 7 days. The problem occurs 5 to 10 times per day. There has been no fever. Associated symptoms include diarrhea. Pertinent negatives include no chills, coughing, dizziness or fever.  Diarrhea  Associated symptoms include vomiting. Pertinent negatives include no chills, coughing or fever.  She has a history of severe acid reflux; she was supposed to see a GI specialist many years ago but she couldn't afford it.  Her vomiting started 3 days ago. Currently she endorses vomiting 30 min after eating yesterday once. She attempted to eat a grilled cheese sandwich.   Today, she has not eaten. She has tried drinking water today; but it "comes back up".   She started having mushy diarrhea the color of green peas on July 1. It then progressed to brown diarrhea by July 9, and it has been mushy brown diarrhea about 4 times a day up yesterday. Last night before she left for work she had liquid brownish green color. This morning her diarrhea was straight liquid emerald green. Now, in MAU, it is turning bile-yellow. This has happened 9 times today, including 3 times in the MAU this evening.  She has had watery diarrhea in the past when she has had "stomach bug"; but nothing like this.   She denies odor to her  diarrhea. No recent antibiotic use.    OB History    Gravida  1   Para      Term      Preterm      AB      Living        SAB      TAB      Ectopic      Multiple      Live Births              Past Medical History:  Diagnosis Date  . Asthma   . Bipolar depression (HCC)   . Hypothyroidism   . PTSD (post-traumatic stress disorder)     Past Surgical History:  Procedure Laterality Date  . NO PAST SURGERIES      No family history on file.  Social History   Tobacco Use  . Smoking status: Never Smoker  . Smokeless tobacco: Never Used  Substance Use Topics  . Alcohol use: Never    Frequency: Never  . Drug use: Never    Allergies:  Allergies  Allergen Reactions  . Sulfa Antibiotics Hives    Medications Prior to Admission  Medication Sig Dispense Refill Last Dose  . calcium carbonate (TUMS - DOSED IN MG ELEMENTAL CALCIUM) 500 MG chewable tablet Chew 1 tablet by mouth daily.     . Prenatal Vit-Fe Fumarate-FA (PRENATAL MULTIVITAMIN) TABS tablet Take  1 tablet by mouth daily at 12 noon.       Review of Systems  Constitutional: Negative for chills and fever.  Respiratory: Negative for cough.   Gastrointestinal: Positive for diarrhea and vomiting.  Genitourinary: Negative for vaginal bleeding and vaginal discharge.  Musculoskeletal: Negative.   Neurological: Negative for dizziness.   Physical Exam   Blood pressure 112/72, pulse 67, temperature 99.1 F (37.3 C), temperature source Oral, resp. rate 18, weight (!) 138.9 kg, last menstrual period 04/16/2019, SpO2 97 %.  Physical Exam  Constitutional: She appears well-developed.  HENT:  Head: Normocephalic.  Neck: Normal range of motion.  GI: Soft.  Musculoskeletal: Normal range of motion.  Neurological: She is alert.  Skin: Skin is warm and dry.  Psychiatric: She has a normal mood and affect.    MAU Course  Procedures  MDM -Urine shows 20 ketones; no obvious signs of infection.  -CBC and  CMP normal -Patient tolerated ice water without vomiting after Zofran -will collect stool sample as well and send for culture Assessment and Plan   1. Morning sickness    2. Patient stable for discharge with RX for Zofran ODT, immodium and fibercon. Return to MAU if she cannot keep down liquids; dietary recommendations for morning sickness given.   3. Start OB care; keep appt with radiology.   3. Patient verbalized understanding of plan of care.   Mervyn Skeeters Darlean Warmoth 06/04/2019, 5:50 PM

## 2019-06-04 NOTE — Discharge Instructions (Signed)
Morning Sickness ° °Morning sickness is when a woman feels nauseous during pregnancy. This nauseous feeling may or may not come with vomiting. It often occurs in the morning, but it can be a problem at any time of day. Morning sickness is most common during the first trimester. In some cases, it may continue throughout pregnancy. Although morning sickness is unpleasant, it is usually harmless unless the woman develops severe and continual vomiting (hyperemesis gravidarum), a condition that requires more intense treatment. °What are the causes? °The exact cause of this condition is not known, but it seems to be related to normal hormonal changes that occur in pregnancy. °What increases the risk? °You are more likely to develop this condition if: °· You experienced nausea or vomiting before your pregnancy. °· You had morning sickness during a previous pregnancy. °· You are pregnant with more than one baby, such as twins. °What are the signs or symptoms? °Symptoms of this condition include: °· Nausea. °· Vomiting. °How is this diagnosed? °This condition is usually diagnosed based on your signs and symptoms. °How is this treated? °In many cases, treatment is not needed for this condition. Making some changes to what you eat may help to control symptoms. Your health care provider may also prescribe or recommend: °· Vitamin B6 supplements. °· Anti-nausea medicines. °· Ginger. °Follow these instructions at home: °Medicines °· Take over-the-counter and prescription medicines only as told by your health care provider. Do not use any prescription, over-the-counter, or herbal medicines for morning sickness without first talking with your health care provider. °· Taking multivitamins before getting pregnant can prevent or decrease the severity of morning sickness in most women. °Eating and drinking °· Eat a piece of dry toast or crackers before getting out of bed in the morning. °· Eat 5 or 6 small meals a day. °· Eat dry and  bland foods, such as rice or a baked potato. Foods that are high in carbohydrates are often helpful. °· Avoid greasy, fatty, and spicy foods. °· Have someone cook for you if the smell of any food causes nausea and vomiting. °· If you feel nauseous after taking prenatal vitamins, take the vitamins at night or with a snack. °· Snack on protein foods between meals if you are hungry. Nuts, yogurt, and cheese are good options. °· Drink fluids throughout the day. °· Try ginger ale made with real ginger, ginger tea made from fresh grated ginger, or ginger candies. °General instructions °· Do not use any products that contain nicotine or tobacco, such as cigarettes and e-cigarettes. If you need help quitting, ask your health care provider. °· Get an air purifier to keep the air in your house free of odors. °· Get plenty of fresh air. °· Try to avoid odors that trigger your nausea. °· Consider trying these methods to help relieve symptoms: °? Wearing an acupressure wristband. These wristbands are often worn for seasickness. °? Acupuncture. °Contact a health care provider if: °· Your home remedies are not working and you need medicine. °· You feel dizzy or light-headed. °· You are losing weight. °Get help right away if: °· You have persistent and uncontrolled nausea and vomiting. °· You faint. °· You have severe pain in your abdomen. °Summary °· Morning sickness is when a woman feels nauseous during pregnancy. This nauseous feeling may or may not come with vomiting. °· Morning sickness is most common during the first trimester. °· It often occurs in the morning, but it can be a problem at   any time of day. °· In many cases, treatment is not needed for this condition. Making some changes to what you eat may help to control symptoms. °This information is not intended to replace advice given to you by your health care provider. Make sure you discuss any questions you have with your health care provider. °Document Released:  12/27/2006 Document Revised: 10/18/2017 Document Reviewed: 12/08/2016 °Elsevier Patient Education © 2020 Elsevier Inc. ° °

## 2019-06-05 ENCOUNTER — Encounter (HOSPITAL_COMMUNITY): Payer: Self-pay

## 2019-06-05 ENCOUNTER — Inpatient Hospital Stay (HOSPITAL_COMMUNITY): Payer: Medicaid Other

## 2019-06-05 ENCOUNTER — Inpatient Hospital Stay (HOSPITAL_COMMUNITY)
Admission: AD | Admit: 2019-06-05 | Discharge: 2019-06-05 | Disposition: A | Discharge status: Home / Self Care | Payer: Medicaid Other | Attending: Obstetrics and Gynecology | Admitting: Obstetrics and Gynecology

## 2019-06-05 DIAGNOSIS — R112 Nausea with vomiting, unspecified: Secondary | ICD-10-CM | POA: Diagnosis not present

## 2019-06-05 DIAGNOSIS — E039 Hypothyroidism, unspecified: Secondary | ICD-10-CM | POA: Insufficient documentation

## 2019-06-05 DIAGNOSIS — Z3A01 Less than 8 weeks gestation of pregnancy: Secondary | ICD-10-CM | POA: Diagnosis not present

## 2019-06-05 DIAGNOSIS — R109 Unspecified abdominal pain: Secondary | ICD-10-CM | POA: Diagnosis not present

## 2019-06-05 DIAGNOSIS — O99511 Diseases of the respiratory system complicating pregnancy, first trimester: Secondary | ICD-10-CM | POA: Insufficient documentation

## 2019-06-05 DIAGNOSIS — Z79899 Other long term (current) drug therapy: Secondary | ICD-10-CM | POA: Diagnosis not present

## 2019-06-05 DIAGNOSIS — O99341 Other mental disorders complicating pregnancy, first trimester: Secondary | ICD-10-CM | POA: Diagnosis not present

## 2019-06-05 DIAGNOSIS — J45909 Unspecified asthma, uncomplicated: Secondary | ICD-10-CM | POA: Insufficient documentation

## 2019-06-05 DIAGNOSIS — O209 Hemorrhage in early pregnancy, unspecified: Secondary | ICD-10-CM | POA: Diagnosis present

## 2019-06-05 DIAGNOSIS — O26891 Other specified pregnancy related conditions, first trimester: Secondary | ICD-10-CM | POA: Diagnosis not present

## 2019-06-05 DIAGNOSIS — Z3491 Encounter for supervision of normal pregnancy, unspecified, first trimester: Secondary | ICD-10-CM

## 2019-06-05 DIAGNOSIS — F4310 Post-traumatic stress disorder, unspecified: Secondary | ICD-10-CM | POA: Diagnosis not present

## 2019-06-05 LAB — URINALYSIS, ROUTINE W REFLEX MICROSCOPIC
Bilirubin Urine: NEGATIVE
Glucose, UA: NEGATIVE mg/dL
Ketones, ur: 20 mg/dL — AB
Leukocytes,Ua: NEGATIVE
Nitrite: NEGATIVE
Protein, ur: NEGATIVE mg/dL
Specific Gravity, Urine: 1.018 (ref 1.005–1.030)
pH: 6 (ref 5.0–8.0)

## 2019-06-05 MED ORDER — ONDANSETRON 4 MG PO TBDP
8.0000 mg | ORAL_TABLET | Freq: Once | ORAL | Status: AC
  Administered 2019-06-05: 8 mg via ORAL
  Filled 2019-06-05: qty 2

## 2019-06-05 NOTE — MAU Note (Signed)
.   Brenda Sawyer is a 28 y.o. at [redacted]w[redacted]d here in MAU reporting: vaginal bleeding when she wipes. Was evaluated in MAU last night and given IV fluids  Onset of complaint: today Pain score: 6 Vitals:   06/05/19 1524  BP: 132/81  Pulse: 60  Resp: 16  Temp: 98.9 F (37.2 C)  SpO2: 100%      Lab orders placed from triage: [facility]

## 2019-06-05 NOTE — Discharge Instructions (Signed)
Vaginal Bleeding During Pregnancy, First Trimester ° °A small amount of bleeding (spotting) from the vagina is common during early pregnancy. Sometimes the bleeding is normal and does not cause problems. At other times, though, bleeding may be a sign of something serious. Tell your doctor about any bleeding from your vagina right away. °Follow these instructions at home: °Activity °· Follow your doctor's instructions about how active you can be. °· If needed, make plans for someone to help with your normal activities. °· Do not have sex or orgasms until your doctor says that this is safe. °General instructions °· Take over-the-counter and prescription medicines only as told by your doctor. °· Watch your condition for any changes. °· Write down: °? The number of pads you use each day. °? How often you change pads. °? How soaked (saturated) your pads are. °· Do not use tampons. °· Do not douche. °· If you pass any tissue from your vagina, save it to show to your doctor. °· Keep all follow-up visits as told by your doctor. This is important. °Contact a doctor if: °· You have vaginal bleeding at any time while you are pregnant. °· You have cramps. °· You have a fever. °Get help right away if: °· You have very bad cramps in your back or belly (abdomen). °· You pass large clots or a lot of tissue from your vagina. °· Your bleeding gets worse. °· You feel light-headed. °· You feel weak. °· You pass out (faint). °· You have chills. °· You are leaking fluid from your vagina. °· You have a gush of fluid from your vagina. °Summary °· Sometimes vaginal bleeding during pregnancy is normal and does not cause problems. At other times, bleeding may be a sign of something serious. °· Tell your doctor about any bleeding from your vagina right away. °· Follow your doctor's instructions about how active you can be. You may need someone to help you with your normal activities. °This information is not intended to replace advice given to  you by your health care provider. Make sure you discuss any questions you have with your health care provider. °Document Released: 03/22/2014 Document Revised: 02/24/2019 Document Reviewed: 02/06/2017 °Elsevier Patient Education © 2020 [employer] Inc. ° °

## 2019-06-05 NOTE — MAU Provider Note (Signed)
Chief Complaint: Vaginal Bleeding   First Provider Initiated Contact with Patient 06/05/19 1620     SUBJECTIVE HPI: Brenda Sawyer is a 28 y.o. G1P0 at 6626w1d who presents to Maternity Admissions reporting vaginal bleeding. Symptoms started this morning. Was initially pink spotting & is now dark red/brown. Only sees bleeding when she wipes. Has had some abdominal cramping as well. Denies dysuria, vaginal discharge, or recent intercourse.  Was seen in MAU last night for n/v. Sent home with scop patch & zofran.   Location: [facility] Quality: cramping Severity: 6/10 on pain scale Duration: 1 day Timing: intermittent Modifying factors: none Associated signs and symptoms: vaginal bleeding  Past Medical History:  Diagnosis Date  . Asthma   . Bipolar depression (HCC)   . Hypothyroidism   . PTSD (post-traumatic stress disorder)    OB History  Gravida Para Term Preterm AB Living  1            SAB TAB Ectopic Multiple Live Births               # Outcome Date GA Lbr Len/2nd Weight Sex Delivery Anes PTL Lv  1 Current            Past Surgical History:  Procedure Laterality Date  . NO PAST SURGERIES     Social History   Socioeconomic History  . Marital status: Single    Spouse name: Not on file  . Number of children: Not on file  . Years of education: Not on file  . Highest education level: Not on file  Occupational History  . Not on file  Social Needs  . Financial resource strain: Not on file  . Food insecurity    Worry: Not on file    Inability: Not on file  . Transportation needs    Medical: Not on file    Non-medical: Not on file  Tobacco Use  . Smoking status: Never Smoker  . Smokeless tobacco: Never Used  Substance and Sexual Activity  . Alcohol use: Never    Frequency: Never  . Drug use: Never  . Sexual activity: Yes  Lifestyle  . Physical activity    Days per week: Not on file    Minutes per session: Not on file  . Stress: Not on file  Relationships  . Social  Musicianconnections    Talks on phone: Not on file    Gets together: Not on file    Attends religious service: Not on file    Active member of club or organization: Not on file    Attends meetings of clubs or organizations: Not on file    Relationship status: Not on file  . Intimate partner violence    Fear of current or ex partner: Not on file    Emotionally abused: Not on file    Physically abused: Not on file    Forced sexual activity: Not on file  Other Topics Concern  . Not on file  Social History Narrative  . Not on file   History reviewed. No pertinent family history. No current facility-administered medications on file prior to encounter.    Current Outpatient Medications on File Prior to Encounter  Medication Sig Dispense Refill  . calcium carbonate (TUMS - DOSED IN MG ELEMENTAL CALCIUM) 500 MG chewable tablet Chew 1 tablet by mouth daily.    Marland Kitchen. levothyroxine (SYNTHROID) 137 MCG tablet Take 137 mcg by mouth daily before breakfast.    . ondansetron (ZOFRAN-ODT) 8 MG disintegrating tablet Take  1 tablet (8 mg total) by mouth every 8 (eight) hours as needed for nausea or vomiting. 60 tablet 0  . Prenatal Vit-Fe Fumarate-FA (PRENATAL MULTIVITAMIN) TABS tablet Take 1 tablet by mouth daily at 12 noon.    Marland Kitchen. scopolamine (TRANSDERM-SCOP, 1.5 MG,) 1 MG/3DAYS Place 1 patch (1.5 mg total) onto the skin every 3 (three) days. 10 patch 12  . loperamide (IMODIUM) 2 MG capsule Take 1 capsule (2 mg total) by mouth 4 (four) times daily as needed for diarrhea or loose stools. 20 capsule 0  . polycarbophil (FIBERCON) 625 MG tablet Take 1 tablet (625 mg total) by mouth daily. 30 tablet 1   Allergies  Allergen Reactions  . Sulfa Antibiotics Hives    I have reviewed patient's Past Medical Hx, Surgical Hx, Family Hx, Social Hx, medications and allergies.   Review of Systems  Constitutional: Negative.   Gastrointestinal: Positive for abdominal pain, nausea and vomiting.  Genitourinary: Positive for  vaginal bleeding. Negative for dysuria and vaginal discharge.    OBJECTIVE Patient Vitals for the past 24 hrs:  BP Temp Pulse Resp SpO2  06/05/19 1524 132/81 98.9 F (37.2 C) 60 16 100 %   Constitutional: Well-developed, well-nourished female in no acute distress.  Cardiovascular: normal rate & rhythm, no murmur Respiratory: normal rate and effort. Lung sounds clear throughout GI: Abd soft, non-tender, Pos BS x 4. No guarding or rebound tenderness MS: Extremities nontender, no edema, normal ROM Neurologic: Alert and oriented x 4.     LAB RESULTS Results for orders placed or performed during the hospital encounter of 06/05/19 (from the past 24 hour(s))  Urinalysis, Routine w reflex microscopic     Status: Abnormal   Collection Time: 06/05/19  3:42 PM  Result Value Ref Range   Color, Urine AMBER (A) YELLOW   APPearance HAZY (A) CLEAR   Specific Gravity, Urine 1.018 1.005 - 1.030   pH 6.0 5.0 - 8.0   Glucose, UA NEGATIVE NEGATIVE mg/dL   Hgb urine dipstick SMALL (A) NEGATIVE   Bilirubin Urine NEGATIVE NEGATIVE   Ketones, ur 20 (A) NEGATIVE mg/dL   Protein, ur NEGATIVE NEGATIVE mg/dL   Nitrite NEGATIVE NEGATIVE   Leukocytes,Ua NEGATIVE NEGATIVE   RBC / HPF 6-10 0 - 5 RBC/hpf   WBC, UA 0-5 0 - 5 WBC/hpf   Bacteria, UA RARE (A) NONE SEEN   Squamous Epithelial / LPF 11-20 0 - 5   Mucus PRESENT     IMAGING [place] Ob Transvaginal  Result Date: 06/05/2019 CLINICAL DATA:  Vaginal bleeding today. Estimated gestational age per LMP 7 weeks 1 day. Previous ultrasound 05/20/2019 demonstrates no intrauterine gestational sac. EXAM: TRANSVAGINAL OB ULTRASOUND TECHNIQUE: Transvaginal ultrasound was performed for complete evaluation of the gestation as well as the maternal uterus, adnexal regions, and pelvic cul-de-sac. COMPARISON:  05/20/2019 FINDINGS: Intrauterine gestational sac: Single visualized. Yolk sac:  Visualized. Embryo:  Visualized. Cardiac Activity: Visualized. Heart Rate: 120 bpm CRL:  6.4 mm   6 w 3 d                  US EDC: 01/26/2020 Subchorionic hemorrhage:  None visualized. Maternal uterus/adnexae: Left ovaries normal size, shape and position with normal color flow. Left ovary is not visualized. No free pelvic fluid. IMPRESSION: Single live IUP with estimated gestational age [redacted] weeks 3 days. Electronically Signed   By: Elberta Fortisaniel  Boyle M.D.   On: 06/05/2019 17:26    MAU COURSE Orders Placed This Encounter  Procedures  . [place]  OB Transvaginal  . Urinalysis, Routine w reflex microscopic  . Discharge patient   Meds ordered this encounter  Medications  . ondansetron (ZOFRAN-ODT) disintegrating tablet 8 mg    MDM Ultrasound shows live IUP. No [place] RH positive Minimal bleeding today  ASSESSMENT 1. Normal IUP (intrauterine pregnancy) on prenatal ultrasound, first trimester   2. Vaginal bleeding in pregnancy, first trimester   3. [redacted] weeks gestation of pregnancy     PLAN Discharge home in stable condition. Bleeding precautions  Follow-up Information    [facility] Follow up.   Specialty: Obstetrics and Gynecology Why: return for worsening symptoms Contact information: [address] [id number] [place] [phone]         Allergies as of 06/05/2019      Reactions   Sulfa Antibiotics Hives      Medication List    TAKE these medications   calcium carbonate 500 MG chewable tablet Commonly known as: TUMS - dosed in mg elemental calcium Chew 1 tablet by mouth daily.   levothyroxine 137 MCG tablet Commonly known as: SYNTHROID Take 137 mcg by mouth daily before breakfast.   loperamide 2 MG capsule Commonly known as: IMODIUM Take 1 capsule (2 mg total) by mouth 4 (four) times daily as needed for diarrhea or loose stools.   ondansetron 8 MG disintegrating tablet Commonly known as: ZOFRAN-ODT Take 1 tablet (8 mg total) by mouth every 8 (eight) hours as needed for nausea or vomiting.   polycarbophil  625 MG tablet Commonly known as: FiberCon Take 1 tablet (625 mg total) by mouth daily.   prenatal multivitamin Tabs tablet Take 1 tablet by mouth daily at 12 noon.   scopolamine 1 MG/3DAYS Commonly known as: Transderm-Scop (1.5 MG) Place 1 patch (1.5 mg total) onto the skin every 3 (three) days.        Jorje Guild, NP 06/05/2019  5:44 PM

## 2019-06-10 LAB — O&P RESULT

## 2019-06-10 LAB — OVA + PARASITE EXAM

## 2019-06-15 ENCOUNTER — Ambulatory Visit (HOSPITAL_COMMUNITY): Payer: Medicaid Other

## 2020-02-26 IMAGING — US OBSTETRIC <14 WK US AND TRANSVAGINAL OB US
1 series · 15 of 28 positions shown · non-contrast
Comparison: None.

CLINICAL DATA: Pelvic pain in first trimester of pregnancy

EXAM:
OBSTETRIC <14 WK US AND TRANSVAGINAL OB US
TECHNIQUE: Both transabdominal and transvaginal ultrasound examinations were
performed for complete evaluation of the gestation as well as the
maternal uterus, adnexal regions, and pelvic cul-de-sac.
Transvaginal technique was performed to assess early pregnancy.

[Series 1: obstetric <14 wk us and transvaginal ob us · 15 of 30 slices shown]
[im 1/30]
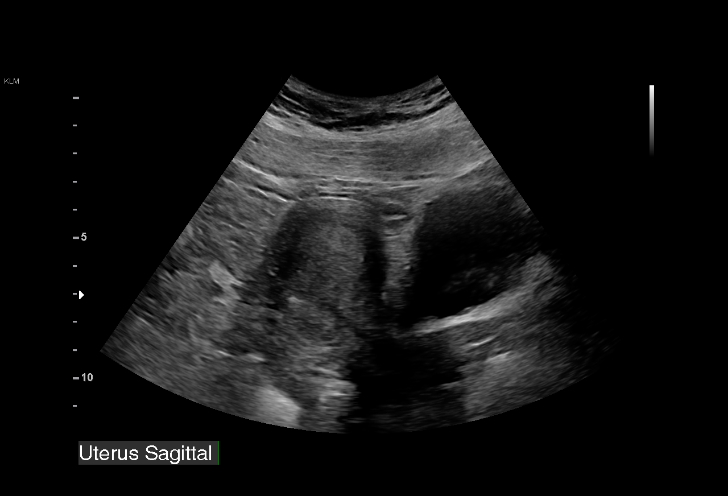
[im 3/30]
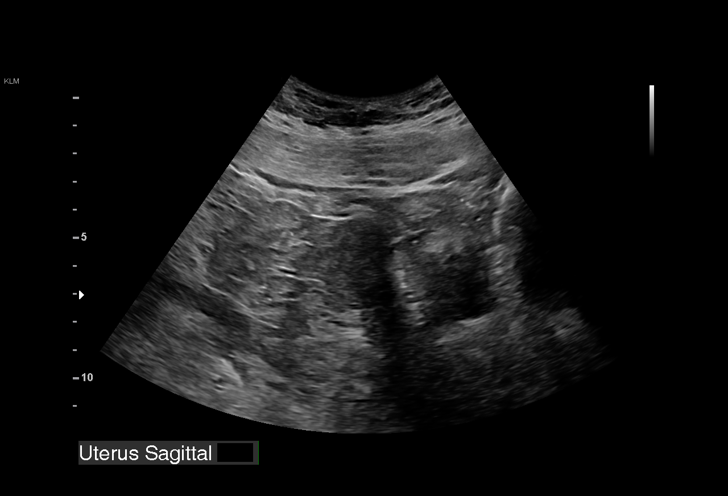
[im 5/30]
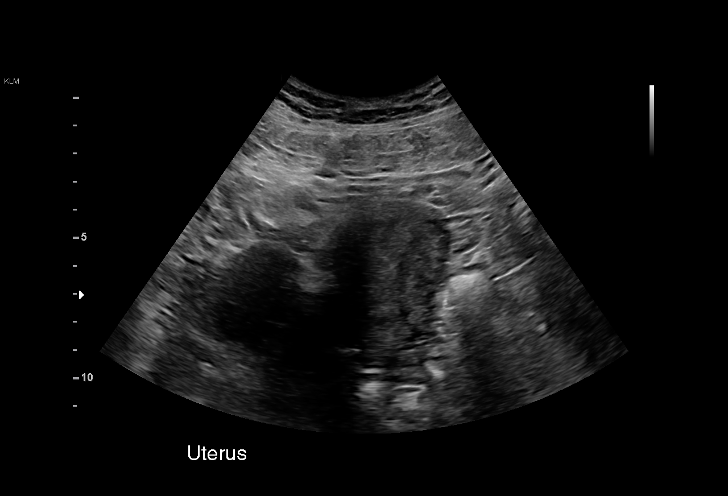
[im 7/30]
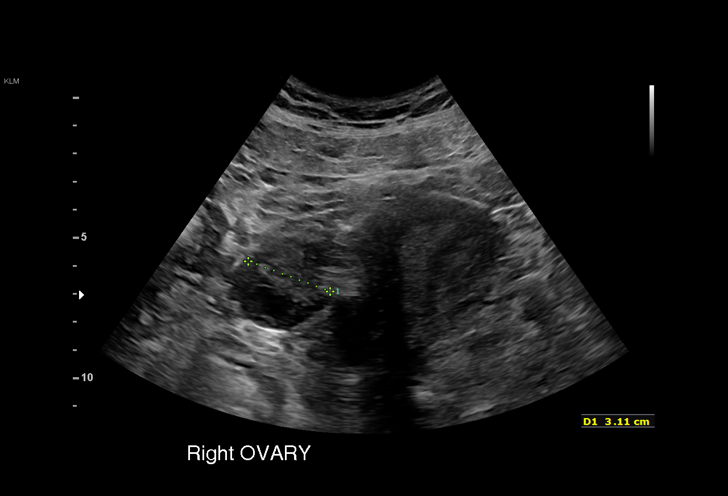
[im 9/30]
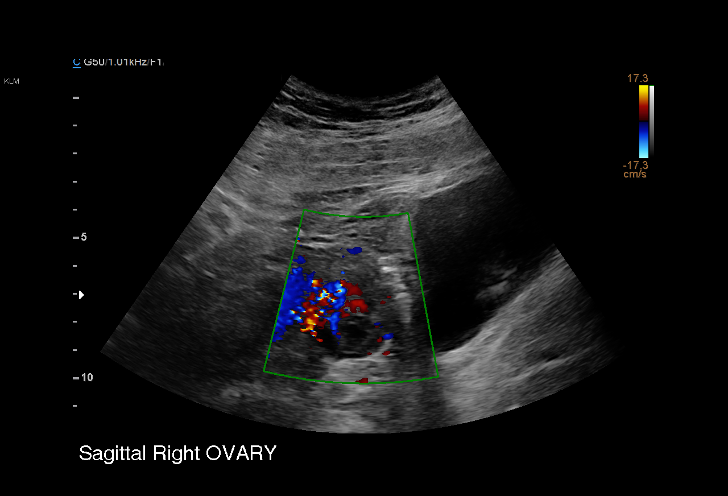
[im 11/30]
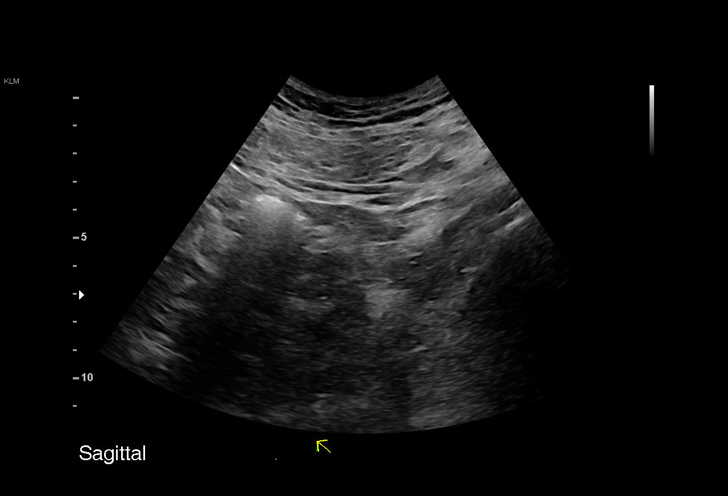
[im 13/30]
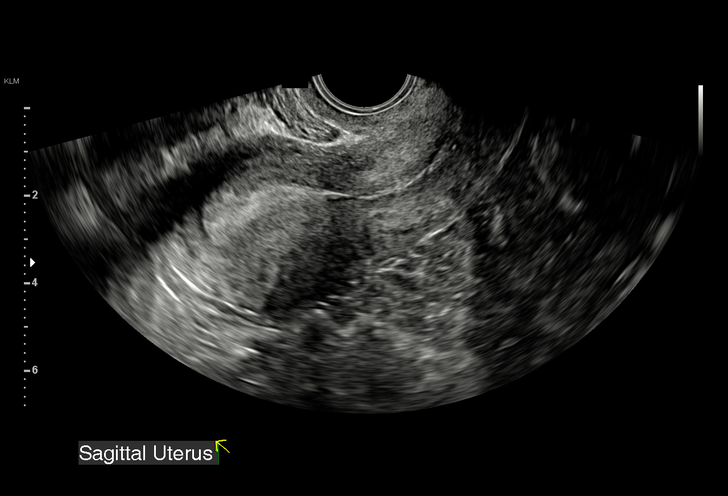
[im 16/30]
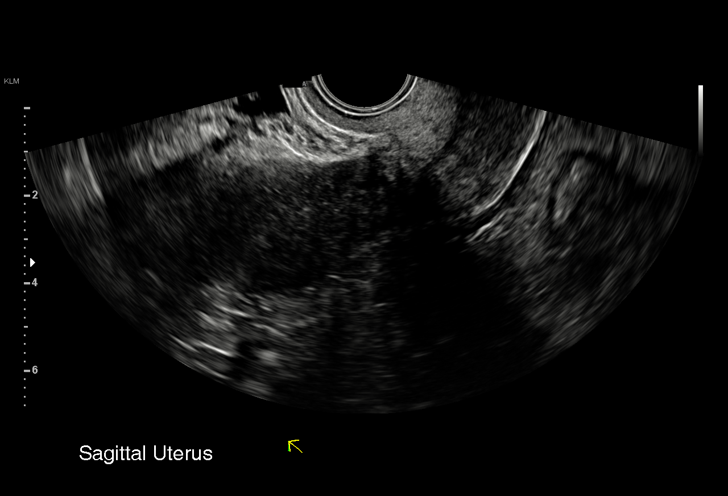
[im 17/30]
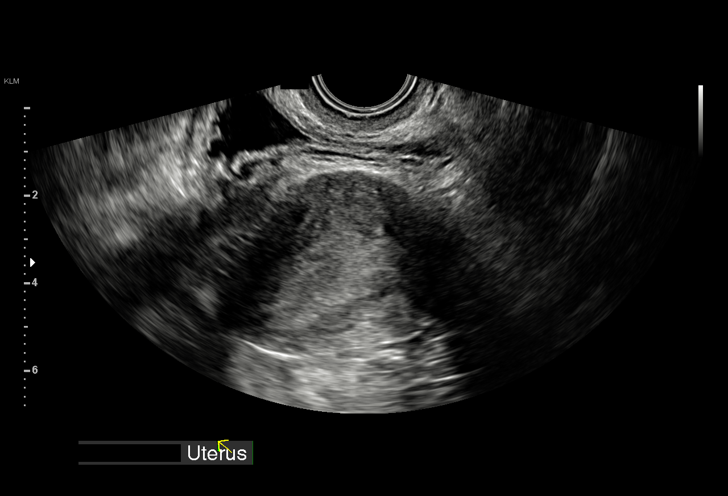
[im 19/30]
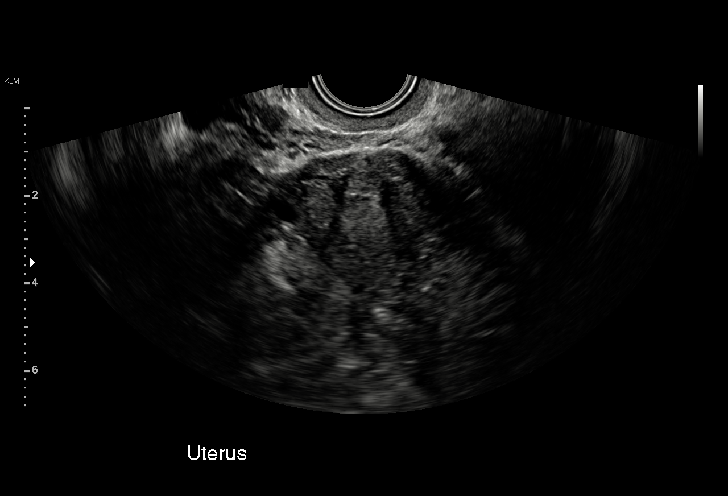
[im 21/30]
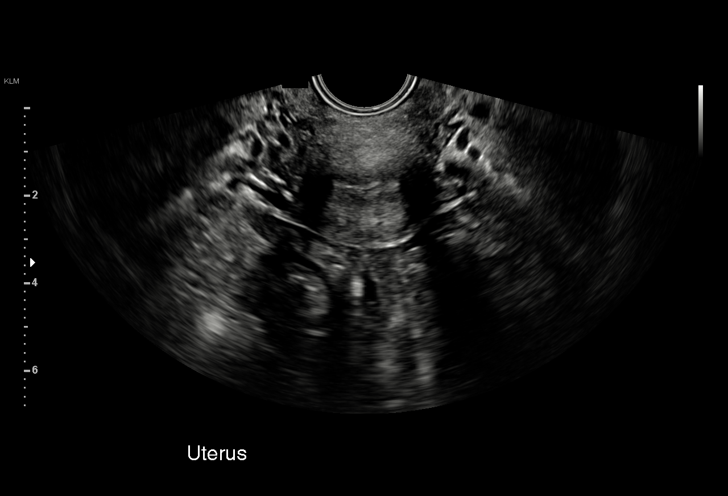
[im 23/30]
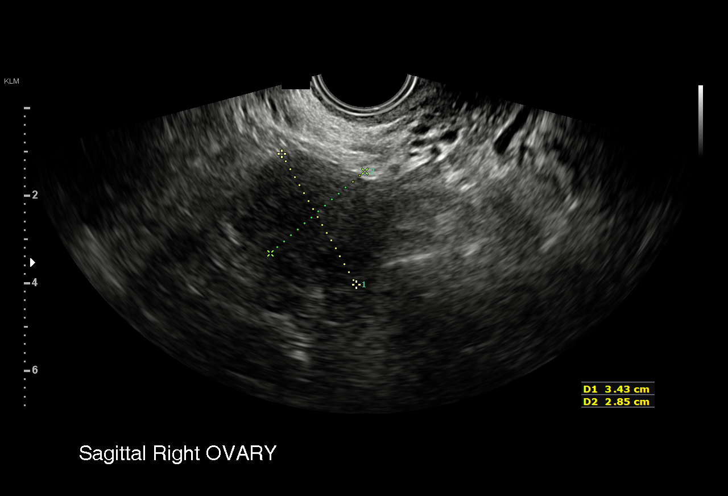
[im 25/30]
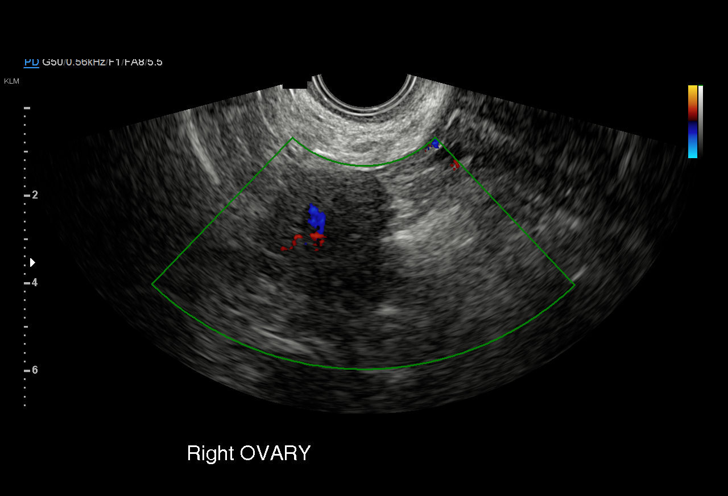
[im 27/30]
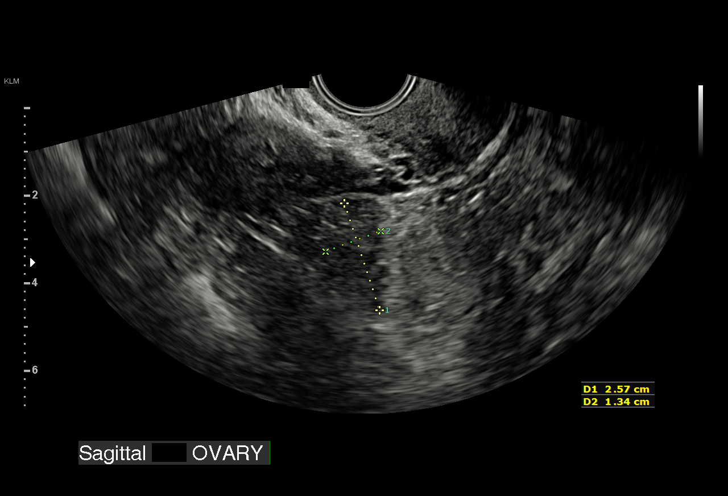
[im 30/30]
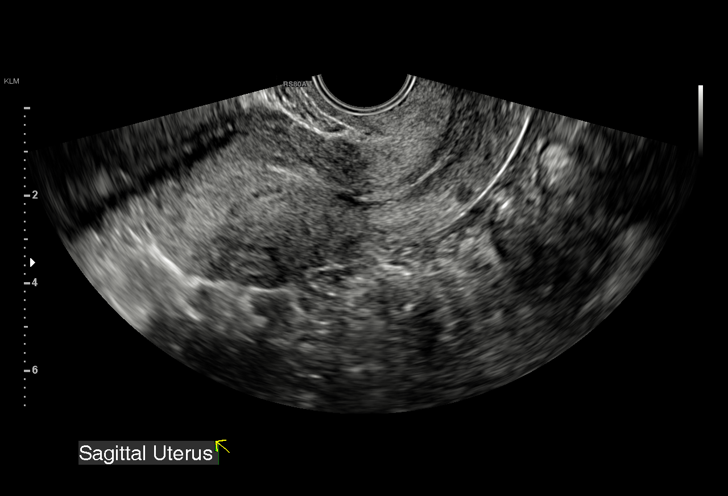

[15 of 28 positions shown; findings below may reference images not displayed]

FINDINGS: Intrauterine gestational sac: None identified

Yolk sac:  N/A

Embryo:  N/A

Cardiac Activity: N/A

Heart Rate: N/A  bpm

MSD:   mm    w     d

CRL:    mm    w    d                  US EDC:

Subchorionic hemorrhage:  N/A

Maternal uterus/adnexae:

Uterus anteverted, normal morphology.

No uterine mass identified.

Endometrial complex unremarkable without fluid or gestational sac.

RIGHT ovary measures 4.2 x 2.9 x 3.1 cm and contains a small corpus
luteum.

LEFT ovary normal size and morphology 2.6 x 1.3 x 1.4 cm.

No free pelvic fluid or adnexal masses.
IMPRESSION: No intrauterine gestation identified.

Findings are compatible with pregnancy of unknown location.

Differential diagnosis includes early intrauterine pregnancy too
early to visualize, spontaneous abortion, and ectopic pregnancy.

Serial quantitative beta hCG and or followup ultrasound recommended
to definitively exclude ectopic pregnancy.

## 2020-03-13 IMAGING — US TRANSVAGINAL OB ULTRASOUND
1 series · 15 of 28 positions shown · non-contrast
Comparison: 05/20/2019

CLINICAL DATA: Vaginal bleeding today. Estimated gestational age
per LMP 7 weeks 1 day. Previous ultrasound 05/20/2019 demonstrates
no intrauterine gestational sac.

EXAM:
TRANSVAGINAL OB ULTRASOUND
TECHNIQUE: Transvaginal ultrasound was performed for complete evaluation of the
gestation as well as the maternal uterus, adnexal regions, and
pelvic cul-de-sac.

[Series 1: transvaginal ob ultrasound · 15 of 35 slices shown]
[im 1/35]
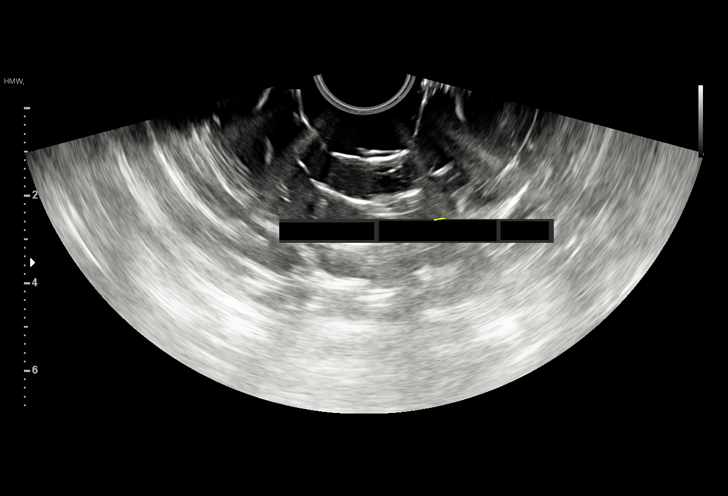
[im 3/35]
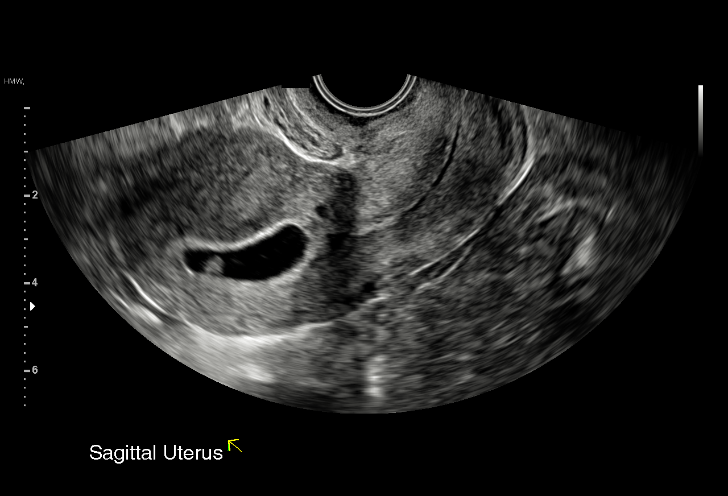
[im 6/35]
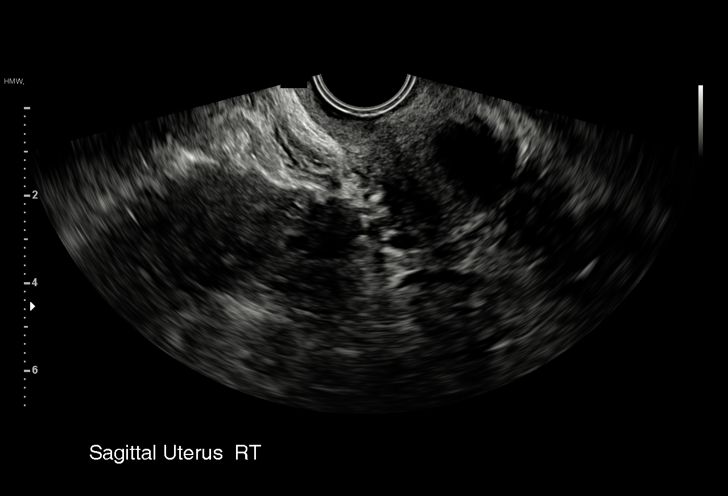
[im 8/35]
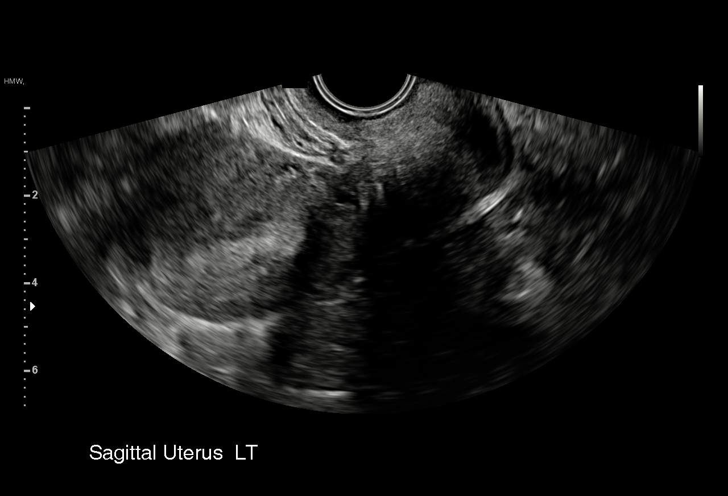
[im 11/35]
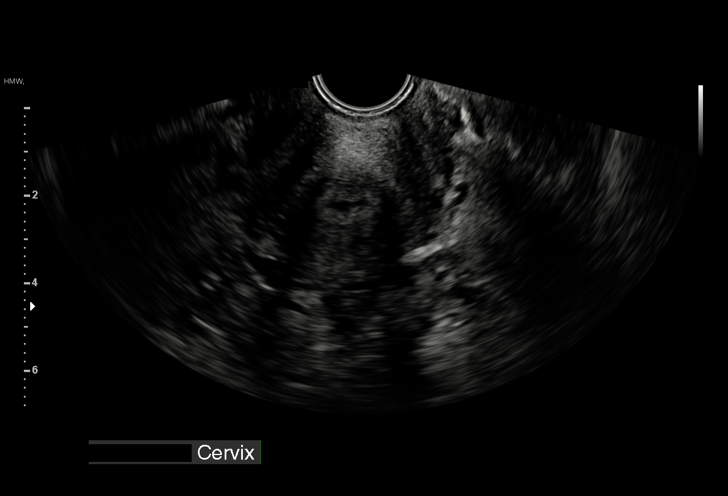
[im 13/35]
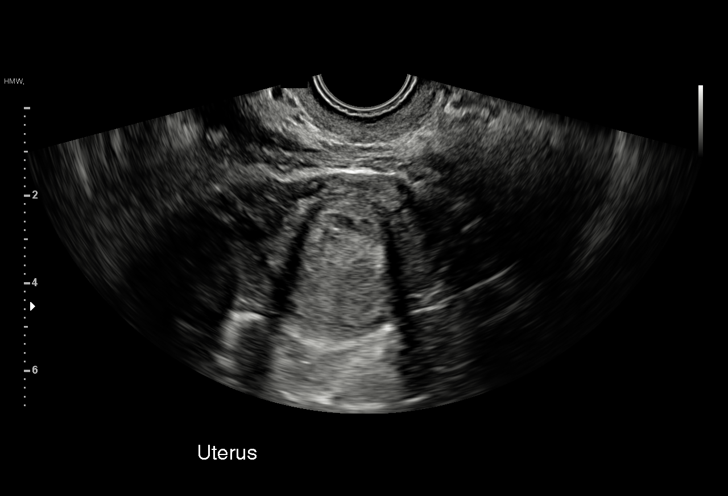
[im 16/35]
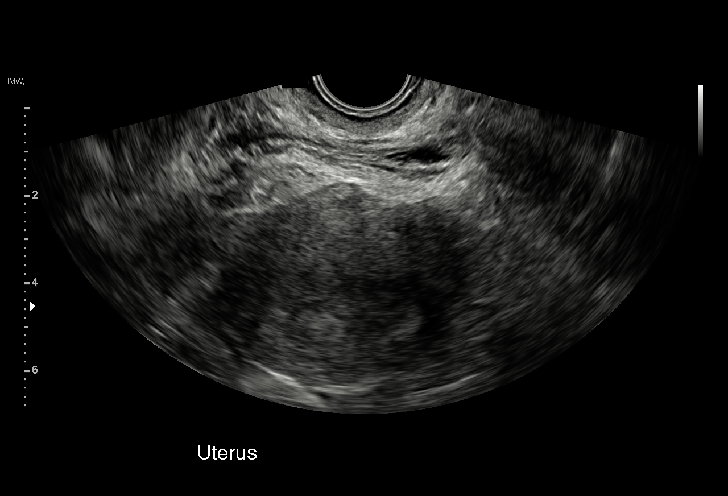
[im 18/35]
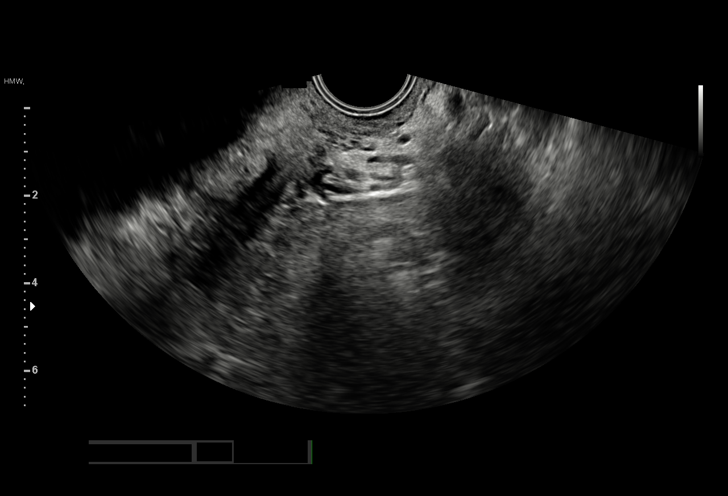
[im 19/35]
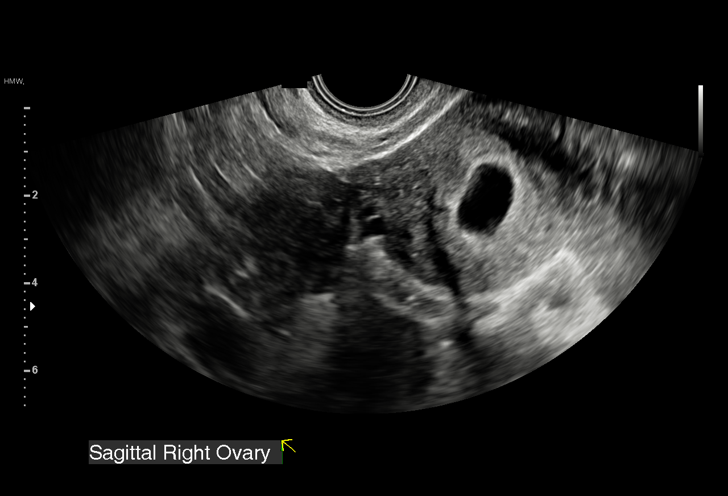
[im 22/35]
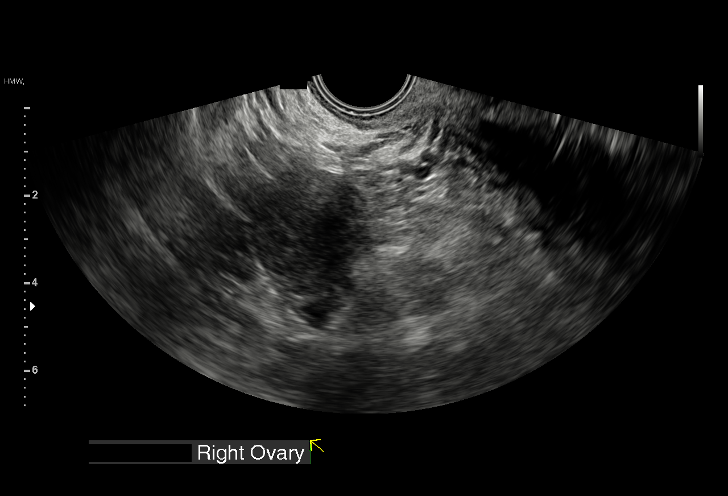
[im 24/35]
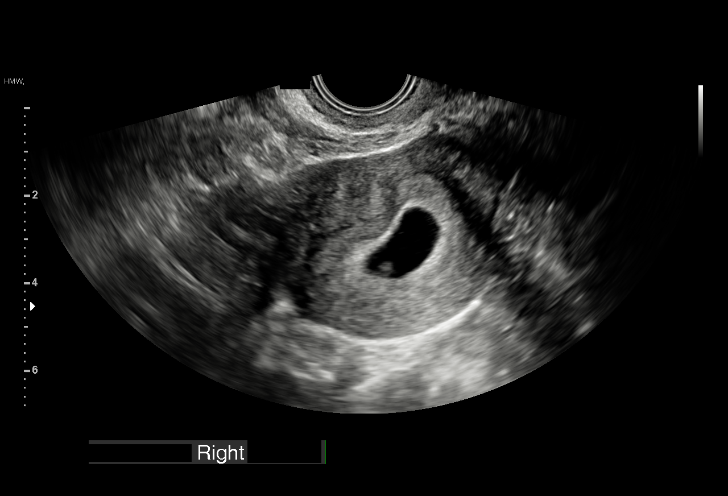
[im 27/35]
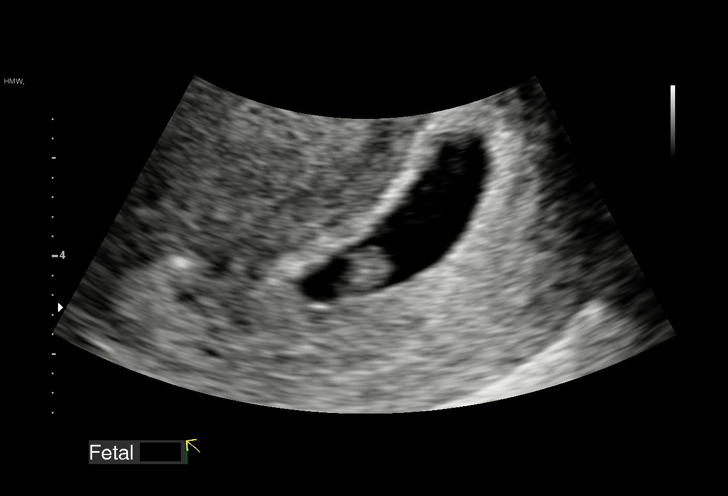
[im 29/35]
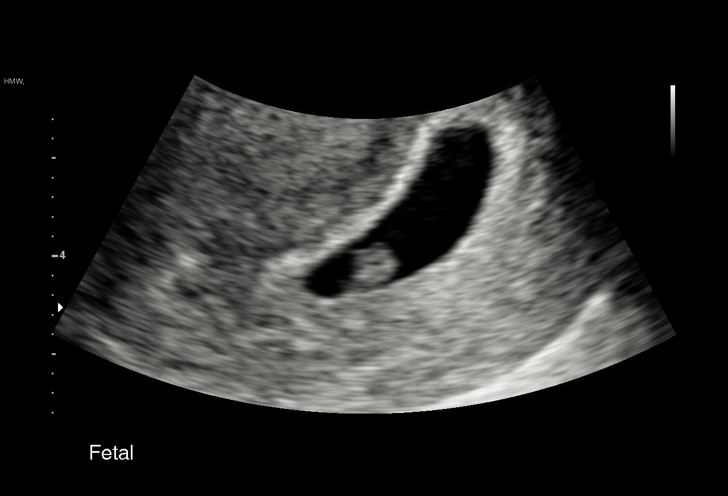
[im 32/35]
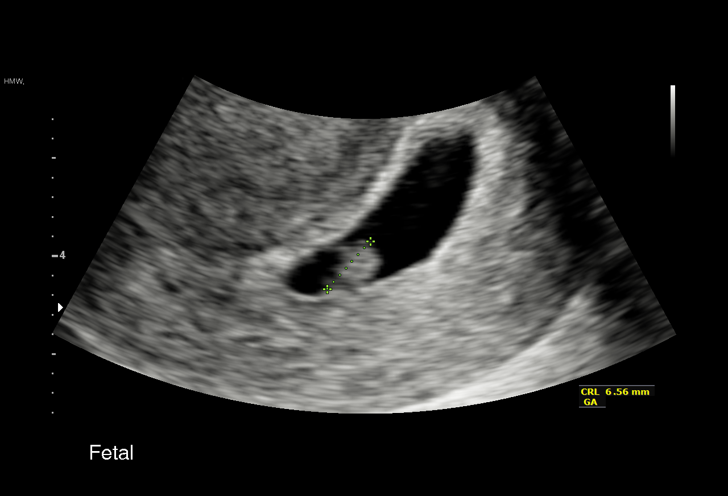
[im 35/35]
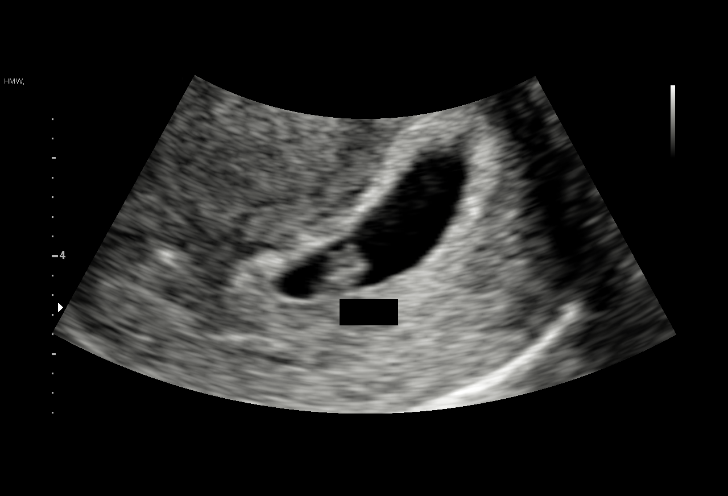

[15 of 28 positions shown; findings below may reference images not displayed]

FINDINGS: Intrauterine gestational sac: Single visualized.

Yolk sac:  Visualized.

Embryo:  Visualized.

Cardiac Activity: Visualized.

Heart Rate: 120 bpm

CRL: 6.4 mm   6 w 3 d                  US EDC: 01/26/2020

Subchorionic hemorrhage:  None visualized.

Maternal uterus/adnexae: Left ovaries normal size, shape and
position with normal color flow. Left ovary is not visualized. No
free pelvic fluid.
IMPRESSION: Single live IUP with estimated gestational age 6 weeks 3 days.

## 2020-10-10 DIAGNOSIS — G8929 Other chronic pain: Secondary | ICD-10-CM

## 2020-10-10 DIAGNOSIS — M546 Pain in thoracic spine: Secondary | ICD-10-CM | POA: Insufficient documentation

## 2020-10-10 DIAGNOSIS — S161XXA Strain of muscle, fascia and tendon at neck level, initial encounter: Secondary | ICD-10-CM

## 2020-10-10 DIAGNOSIS — E039 Hypothyroidism, unspecified: Secondary | ICD-10-CM

## 2020-10-10 DIAGNOSIS — E559 Vitamin D deficiency, unspecified: Secondary | ICD-10-CM | POA: Insufficient documentation

## 2020-10-10 HISTORY — DX: Strain of muscle, fascia and tendon at neck level, initial encounter: S16.1XXA

## 2020-10-10 HISTORY — DX: Vitamin D deficiency, unspecified: E55.9

## 2020-10-10 HISTORY — DX: Other chronic pain: G89.29

## 2020-10-10 HISTORY — DX: Hypothyroidism, unspecified: E03.9

## 2020-10-11 DIAGNOSIS — R9389 Abnormal findings on diagnostic imaging of other specified body structures: Secondary | ICD-10-CM | POA: Insufficient documentation

## 2022-01-02 ENCOUNTER — Encounter: Payer: Self-pay | Admitting: Certified Nurse Midwife

## 2022-01-09 ENCOUNTER — Encounter: Payer: Self-pay | Admitting: Cardiology

## 2022-01-09 ENCOUNTER — Encounter: Payer: Self-pay | Admitting: *Deleted

## 2022-01-11 DIAGNOSIS — J45909 Unspecified asthma, uncomplicated: Secondary | ICD-10-CM | POA: Insufficient documentation

## 2022-01-11 DIAGNOSIS — R400 Somnolence: Secondary | ICD-10-CM | POA: Insufficient documentation

## 2022-01-11 DIAGNOSIS — E662 Morbid (severe) obesity with alveolar hypoventilation: Secondary | ICD-10-CM | POA: Insufficient documentation

## 2022-01-11 DIAGNOSIS — F419 Anxiety disorder, unspecified: Secondary | ICD-10-CM | POA: Insufficient documentation

## 2022-01-11 DIAGNOSIS — M419 Scoliosis, unspecified: Secondary | ICD-10-CM | POA: Insufficient documentation

## 2022-01-11 DIAGNOSIS — F319 Bipolar disorder, unspecified: Secondary | ICD-10-CM | POA: Insufficient documentation

## 2022-01-11 DIAGNOSIS — G4730 Sleep apnea, unspecified: Secondary | ICD-10-CM | POA: Insufficient documentation

## 2022-01-11 DIAGNOSIS — G4733 Obstructive sleep apnea (adult) (pediatric): Secondary | ICD-10-CM | POA: Insufficient documentation

## 2022-01-11 DIAGNOSIS — E039 Hypothyroidism, unspecified: Secondary | ICD-10-CM | POA: Insufficient documentation

## 2022-01-11 DIAGNOSIS — F4310 Post-traumatic stress disorder, unspecified: Secondary | ICD-10-CM | POA: Insufficient documentation

## 2022-01-16 ENCOUNTER — Other Ambulatory Visit: Payer: Self-pay

## 2022-01-16 ENCOUNTER — Ambulatory Visit: Payer: Medicaid Other | Admitting: Cardiology

## 2022-01-16 ENCOUNTER — Encounter: Payer: Self-pay | Admitting: Cardiology

## 2022-03-06 ENCOUNTER — Encounter: Payer: Self-pay | Admitting: Cardiology

## 2022-03-06 ENCOUNTER — Ambulatory Visit: Payer: Medicaid Other | Admitting: Cardiology

## 2022-03-06 VITALS — BP 116/70 | HR 83 | Ht 67.0 in | Wt 354.2 lb

## 2022-03-06 DIAGNOSIS — E662 Morbid (severe) obesity with alveolar hypoventilation: Secondary | ICD-10-CM | POA: Diagnosis not present

## 2022-03-06 DIAGNOSIS — I517 Cardiomegaly: Secondary | ICD-10-CM | POA: Diagnosis not present

## 2022-03-06 NOTE — Progress Notes (Signed)
?Cardiology Office Note:   ? ?Date:  03/06/2022  ? ?ID:  Brenda Sawyer, DOB Apr 15, 1991, MRN [id number] ? ?PCP:  Darrol Jump, NP  ?Cardiologist:  Jenean Lindau, MD  ? ?Referring MD: Gardiner Rhyme, MD  ? ? ?ASSESSMENT:   ? ?1. 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 ?PLAN:   ? ?In order of problems listed above: ? ?Primary prevention stressed with the patient.  Importance of compliance with diet medication stressed and she vocalized understanding. ?Morbid obesity: Diet was emphasized.  Weight reduction stressed this is followed by primary care.  Risks of obesity emphasized. ?Cardiomegaly noted on chest x-ray: I think this is more of a technical issue than a cardiac issue.  She said she has had an echocardiogram and I will try to get a copy of it.  She was told it was fine. ?Obstructive sleep apnea: Patient is being evaluated at this and followed by primary care. ?She will be seen in follow-up appointment as needed basis only.  I will review cardiac echocardiogram and give her a call if there is any more pertinent information or evaluation needed.  Patient had multiple questions which were answered to her satisfaction. ? ? ?Medication Adjustments/Labs and Tests Ordered: ?Current medicines are reviewed at length with the patient today.  Concerns regarding medicines are outlined above.  ?No orders of the defined types were placed in this encounter. ? ?No orders of the defined types were placed in this encounter. ? ? ? ?History of Present Illness:   ? ?Brenda Sawyer is a 31 y.o. female who is being seen today for the evaluation of cardiomegaly at the request of Gardiner Rhyme, MD. patient is a pleasant 31 year old female.  She has past medical history of morbid obesity and diet-controlled diabetes mellitus.  She is being evaluated for obesity hypoventilation and sleep apnea.  She denies any problems at this time.  She had a chest x-ray and cardiomegaly was documented and therefore she is here for  evaluation.  She tells me echo was done and it was unremarkable.  At the time of my evaluation, the patient is alert awake oriented and in no distress. ? ?Past Medical History:  ?Diagnosis Date  ? Acquired hypothyroidism 10/10/2020  ? Anxiety   ? Asthma   ? Bipolar depression ([place])   ? Chronic midline thoracic back pain 10/10/2020  ? Hypothyroidism   ? Mood disorder ([place]) 01/03/2017  ? 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 Neck strain, initial encounter 10/10/2020  ? OSA (obstructive sleep apnea)   ? PTSD (post-traumatic stress disorder)   ? Scoliosis   ? Somnolence   ? Vitamin D deficiency 10/10/2020  ? ? ?Past Surgical History:  ?Procedure Laterality Date  ? NO PAST SURGERIES    ? ? ?Current Medications: ?Current Meds  ?Medication Sig  ? albuterol (VENTOLIN HFA) 108 (90 Base) MCG/ACT inhaler Inhale 2 puffs into the lungs every 6 (six) hours as needed for shortness of breath.  ? Fluticasone-Umeclidin-Vilant (TRELEGY ELLIPTA) 200-62.5-25 MCG/ACT AEPB Inhale 1 puff into the lungs daily.  ? levothyroxine (SYNTHROID) 150 MCG tablet Take 150 mcg by mouth daily.  ? metroNIDAZOLE (FLAGYL) 500 MG tablet Take 500 mg by mouth daily.  ? montelukast (SINGULAIR) 10 MG tablet Take 10 mg by mouth at bedtime.  ? norethindrone-ethinyl estradiol-FE (LOESTRIN FE) 1-20 MG-MCG tablet Take 1 tablet by mouth daily.  ? pantoprazole (PROTONIX) 40 MG tablet Take 40 mg by mouth daily.  ? Semaglutide (  RYBELSUS) 3 MG TABS Take 3 mg by mouth daily.  ?  ? ?Allergies:   Bactrim [sulfamethoxazole-trimethoprim] and Sulfa antibiotics  ? ?Social History  ? ?Socioeconomic History  ? Marital status: Single  ?  Spouse name: Not on file  ? Number of children: Not on file  ? Years of education: Not on file  ? Highest education level: Not on file  ?Occupational History  ? Not on file  ?Tobacco Use  ? Smoking status: Former  ?  Packs/day: 0.50  ?  Years: 15.00  ?  Pack years: 7.50  ?  Types: Cigarettes  ?  Quit date: 2019  ?  Years  since quitting: 4.2  ? Smokeless tobacco: Never  ?Vaping Use  ? Vaping Use: Never used  ?Substance and Sexual Activity  ? Alcohol use: Never  ? Drug use: Never  ? Sexual activity: Yes  ?Other Topics Concern  ? Not on file  ?Social History Narrative  ? Not on file  ? ?Social Determinants of Health  ? ?Financial Resource Strain: Not on file  ?Food Insecurity: Not on file  ?Transportation Needs: Not on file  ?Physical Activity: Not on file  ?Stress: Not on file  ?Social Connections: Not on file  ?  ? ?Family History: ?The patient's family history includes Depression in her mother; Diabetes in her father; Heart disease in her maternal grandfather and maternal grandmother; Hypercholesterolemia in her father; Hypertension in her father; Hypothyroidism in her father; Lung cancer in her paternal grandfather. ? ?ROS:   ?Please see the history of present illness.    ?All other systems reviewed and are negative. ? ?EKGs/Labs/Other Studies Reviewed:   ? ?The following studies were reviewed today: ?EKG reveals sinus rhythm and nonspecific ST-T changes ? ? ?Recent Labs: ?No results found for requested labs within last 8760 hours.  ?Recent Lipid Panel ?No results found for: CHOL, TRIG, HDL, CHOLHDL, VLDL, LDLCALC, LDLDIRECT ? ?Physical Exam:   ? ?VS:  BP 116/70   Pulse 83   Ht 5\' 7"  (1.702 m)   Wt (!) 354 lb 3.2 oz (160.7 kg)   SpO2 97%   BMI 55.48 kg/m?    ? ?Wt Readings from Last 3 Encounters:  ?03/06/22 (!) 354 lb 3.2 oz (160.7 kg)  ?10/24/21 (!) 347 lb (157.4 kg)  ?06/04/19 (!) 306 lb 3.2 oz (138.9 kg)  ?  ? ?GEN: Patient is in no acute distress ?HEENT: Normal ?NECK: No JVD; No carotid bruits ?LYMPHATICS: No lymphadenopathy ?CARDIAC: S1 S2 regular, 2/6 systolic murmur at the apex. ?RESPIRATORY:  Clear to auscultation without rales, wheezing or rhonchi  ?ABDOMEN: Soft, non-tender, non-distended ?MUSCULOSKELETAL:  No edema; No deformity  ?SKIN: Warm and dry ?NEUROLOGIC:  Alert and oriented x 3 ?PSYCHIATRIC:  Normal affect   ? ? ?Signed, ?Jenean Lindau, MD  ?03/06/2022 4:03 PM    ?[place]   ?

## 2022-03-06 NOTE — Patient Instructions (Signed)
Medication Instructions:  Your physician recommends that you continue on your current medications as directed. Please refer to the Current Medication list given to you today.  *If you need a refill on your cardiac medications before your next appointment, please call your pharmacy*   Lab Work: NONE If you have labs (blood work) drawn today and your tests are completely normal, you will receive your results only by: MyChart Message (if you have MyChart) OR A paper copy in the mail If you have any lab test that is abnormal or we need to change your treatment, we will call you to review the results.   Testing/Procedures: NONE   Follow-Up: At [facility], you and your health needs are our priority.  As part of our continuing mission to provide you with exceptional heart care, we have created designated Provider Care Teams.  These Care Teams include your primary Cardiologist (physician) and Advanced Practice Providers (APPs -  Physician Assistants and Nurse Practitioners) who all work together to provide you with the care you need, when you need it.  We recommend signing up for the patient portal called "MyChart".  Sign up information is provided on this After Visit Summary.  MyChart is used to connect with patients for Virtual Visits (Telemedicine).  Patients are able to view lab/test results, encounter notes, upcoming appointments, etc.  Non-urgent messages can be sent to your provider as well.   To learn more about what you can do with MyChart, go to [email or web address].    Your next appointment:   9 month(s)  The format for your next appointment:   In Person  Provider:   Rajan Revankar, MD    Other Instructions   Important Information About Sugar       

## 2022-04-23 ENCOUNTER — Encounter: Payer: Self-pay | Admitting: Gastroenterology

## 2022-05-10 ENCOUNTER — Ambulatory Visit: Payer: Medicaid Other | Admitting: Gastroenterology

## 2022-05-31 ENCOUNTER — Ambulatory Visit: Payer: Medicaid Other | Admitting: Gastroenterology

## 2022-05-31 ENCOUNTER — Telehealth: Payer: Self-pay | Admitting: Gastroenterology

## 2022-05-31 NOTE — Telephone Encounter (Signed)
Good Morning Dr. Chales Abrahams,   Patient called stating that she had wrote the wrong date done for her appointment with you this morning at 10:40, patient stated she needed to reschedule due to not going to make it on time.   Patient was rescheduled for 8/23 at 10:00.

## 2022-07-11 ENCOUNTER — Ambulatory Visit: Payer: Medicaid Other | Admitting: Gastroenterology

## 2022-07-31 ENCOUNTER — Other Ambulatory Visit: Payer: Self-pay

## 2022-07-31 DIAGNOSIS — I517 Cardiomegaly: Secondary | ICD-10-CM | POA: Insufficient documentation

## 2022-08-02 ENCOUNTER — Ambulatory Visit: Payer: Medicaid Other | Attending: Cardiology | Admitting: Cardiology

## 2022-08-02 ENCOUNTER — Encounter: Payer: Self-pay | Admitting: Cardiology

## 2022-08-02 VITALS — BP 122/84 | HR 100 | Temp 100.0°F | Ht 67.0 in | Wt 320.0 lb

## 2022-08-02 DIAGNOSIS — G4733 Obstructive sleep apnea (adult) (pediatric): Secondary | ICD-10-CM

## 2022-08-02 DIAGNOSIS — E039 Hypothyroidism, unspecified: Secondary | ICD-10-CM | POA: Diagnosis not present

## 2022-08-02 DIAGNOSIS — E662 Morbid (severe) obesity with alveolar hypoventilation: Secondary | ICD-10-CM

## 2022-08-02 NOTE — Patient Instructions (Signed)

## 2022-08-02 NOTE — Progress Notes (Signed)
Cardiology Office Note:    Date:  08/02/2022   ID:  Brenda Sawyer, DOB 11-Nov-1991, MRN [id number]  PCP:  [facility]  Cardiologist:  Garwin Brothers, MD   Referring MD: No ref. provider found    ASSESSMENT:    1. Morbid (severe) obesity with alveolar hypoventilation (HCC)   2. OSA (obstructive sleep apnea)   3. Acquired hypothyroidism    PLAN:    In order of problems listed above:  I reassured the patient about the findings of cardiomegaly.  Echocardiogram was unremarkable and I had a detailed discussion with her about it at length again.  Primary prevention stressed.  Importance of compliance with diet and medication stressed and she vocalized understanding.  She was advised to walk on a regular basis. She has elevated heart rate at times and I told her to keep yourself well-hydrated with salt and water in the diet.  I told her to avoid hypotension. Obesity: Weight reduction stressed and diet was emphasized. Hypo-thyroidism: Managed by primary care.  She has been mentioned that if this does not come under control then she will be referred to an endocrinologist. Sleep apnea: Sleep health issues were discussed.  Patient will be seen in follow-up appointment on a as needed basis.  She had multiple questions which were answered to her satisfaction.   Medication Adjustments/Labs and Tests Ordered: Current medicines are reviewed at length with the patient today.  Concerns regarding medicines are outlined above.  No orders of the defined types were placed in this encounter.  No orders of the defined types were placed in this encounter.    Chief Complaint  Patient presents with   Low BP    Ongoing for    Dizziness     History of Present Illness:    Brenda Sawyer is a 31 y.o. female.  Patient has past medical history of hypothyroidism managed by primary care, morbid obesity and obstructive sleep apnea.  She is trying to lose weight.  She was  evaluated with a diagnosis of cardiomegaly.  Echocardiogram was unremarkable.  Past Medical History:  Diagnosis Date   Acquired hypothyroidism 10/10/2020   Anxiety    Asthma    Bipolar depression (HCC)    Cardiomegaly    Chronic midline thoracic back pain 10/10/2020   Hypothyroidism    Mood disorder (HCC) 01/03/2017   Morbid (severe) obesity with alveolar hypoventilation (HCC)    Neck strain, initial encounter 10/10/2020   OSA (obstructive sleep apnea)    PTSD (post-traumatic stress disorder)    Scoliosis    Somnolence    Vitamin D deficiency 10/10/2020    Past Surgical History:  Procedure Laterality Date   NO PAST SURGERIES      Current Medications: Current Meds  Medication Sig   albuterol (VENTOLIN HFA) 108 (90 Base) MCG/ACT inhaler Inhale 2 puffs into the lungs every 6 (six) hours as needed for shortness of breath.   Budeson-Glycopyrrol-Formoterol (BREZTRI AEROSPHERE) 160-9-4.8 MCG/ACT AERO Inhale 2 puffs into the lungs daily.   ketoconazole (NIZORAL) 2 % shampoo Apply 1 Application topically 2 (two) times a week.   levothyroxine (SYNTHROID) 175 MCG tablet Take 175 mcg by mouth daily.   montelukast (SINGULAIR) 10 MG tablet Take 10 mg by mouth at bedtime.   norethindrone-ethinyl estradiol-FE (LOESTRIN FE) 1-20 MG-MCG tablet Take 1 tablet by mouth daily.   pantoprazole (PROTONIX) 40 MG tablet Take 40 mg by mouth daily.   phentermine (ADIPEX-P) 37.5 MG tablet Take 37.5  mg by mouth daily before breakfast.   Semaglutide (RYBELSUS) 3 MG TABS Take 3 mg by mouth daily.   terbinafine (LAMISIL) 250 MG tablet Take 250 mg by mouth daily.   topiramate (TOPAMAX) 100 MG tablet Take 100 mg by mouth 2 (two) times daily.   [DISCONTINUED] Fluticasone-Umeclidin-Vilant (TRELEGY ELLIPTA) 200-62.5-25 MCG/ACT AEPB Inhale 1 puff into the lungs daily.   [DISCONTINUED] lisinopril-hydrochlorothiazide (ZESTORETIC) 20-12.5 MG tablet Take 1 tablet by mouth daily.   [DISCONTINUED] metroNIDAZOLE (FLAGYL)  500 MG tablet Take 500 mg by mouth daily.     Allergies:   Bactrim [sulfamethoxazole-trimethoprim] and Sulfa antibiotics   Social History   Socioeconomic History   Marital status: Single    Spouse name: Not on file   Number of children: Not on file   Years of education: Not on file   Highest education level: Not on file  Occupational History   Not on file  Tobacco Use   Smoking status: Former    Packs/day: 0.50    Years: 15.00    Total pack years: 7.50    Types: Cigarettes    Quit date: 2019    Years since quitting: 4.7   Smokeless tobacco: Never  Vaping Use   Vaping Use: Never used  Substance and Sexual Activity   Alcohol use: Never   Drug use: Never   Sexual activity: Yes  Other Topics Concern   Not on file  Social History Narrative   Not on file   Social Determinants of Health   Financial Resource Strain: Not on file  Food Insecurity: Not on file  Transportation Needs: Not on file  Physical Activity: Not on file  Stress: Not on file  Social Connections: Not on file     Family History: The patient's family history includes Depression in her mother; Diabetes in her father; Heart disease in her maternal grandfather and maternal grandmother; Hypercholesterolemia in her father; Hypertension in her father; Hypothyroidism in her father; Lung cancer in her paternal grandfather.  ROS:   Please see the history of present illness.    All other systems reviewed and are negative.  EKGs/Labs/Other Studies Reviewed:    The following studies were reviewed today: I discussed my findings with the patient at length.   Recent Labs: No results found for requested labs within last 365 days.  Recent Lipid Panel No results found for: "CHOL", "TRIG", "HDL", "CHOLHDL", "VLDL", "LDLCALC", "LDLDIRECT"  Physical Exam:    VS:  Temp 100 F (37.8 C)   Ht 5\' 7"  (1.702 m)   Wt (!) 320 lb (145.2 kg)   SpO2 96%   BMI 50.12 kg/m     Wt Readings from Last 3 Encounters:   08/02/22 (!) 320 lb (145.2 kg)  03/06/22 (!) 354 lb 3.2 oz (160.7 kg)  10/24/21 (!) 347 lb (157.4 kg)     GEN: Patient is in no acute distress HEENT: Normal NECK: No JVD; No carotid bruits LYMPHATICS: No lymphadenopathy CARDIAC: Hear sounds regular, 2/6 systolic murmur at the apex. RESPIRATORY:  Clear to auscultation without rales, wheezing or rhonchi  ABDOMEN: Soft, non-tender, non-distended MUSCULOSKELETAL:  No edema; No deformity  SKIN: Warm and dry NEUROLOGIC:  Alert and oriented x 3 PSYCHIATRIC:  Normal affect   Signed, 14/06/22, MD  08/02/2022 10:59 AM    [facility]

## 2022-09-10 ENCOUNTER — Other Ambulatory Visit (INDEPENDENT_AMBULATORY_CARE_PROVIDER_SITE_OTHER): Payer: Medicaid Other

## 2022-09-10 ENCOUNTER — Encounter: Payer: Self-pay | Admitting: Gastroenterology

## 2022-09-10 ENCOUNTER — Ambulatory Visit: Payer: Medicaid Other | Admitting: Gastroenterology

## 2022-09-10 VITALS — HR 83 | Ht 67.0 in | Wt 320.0 lb

## 2022-09-10 DIAGNOSIS — K219 Gastro-esophageal reflux disease without esophagitis: Secondary | ICD-10-CM | POA: Diagnosis not present

## 2022-09-10 DIAGNOSIS — K625 Hemorrhage of anus and rectum: Secondary | ICD-10-CM | POA: Diagnosis not present

## 2022-09-10 DIAGNOSIS — K580 Irritable bowel syndrome with diarrhea: Secondary | ICD-10-CM | POA: Diagnosis not present

## 2022-09-10 LAB — CBC WITH DIFFERENTIAL/PLATELET
Basophils Absolute: 0.1 10*3/uL (ref 0.0–0.1)
Basophils Relative: 1 % (ref 0.0–3.0)
Eosinophils Absolute: 0.1 10*3/uL (ref 0.0–0.7)
Eosinophils Relative: 0.6 % (ref 0.0–5.0)
HCT: 44.4 % (ref 36.0–46.0)
Hemoglobin: 14.9 g/dL (ref 12.0–15.0)
Lymphocytes Relative: 19.5 % (ref 12.0–46.0)
Lymphs Abs: 2.2 10*3/uL (ref 0.7–4.0)
MCHC: 33.5 g/dL (ref 30.0–36.0)
MCV: 91.1 fl (ref 78.0–100.0)
Monocytes Absolute: 0.5 10*3/uL (ref 0.1–1.0)
Monocytes Relative: 4.6 % (ref 3.0–12.0)
Neutro Abs: 8.4 10*3/uL — ABNORMAL HIGH (ref 1.4–7.7)
Neutrophils Relative %: 74.3 % (ref 43.0–77.0)
Platelets: 271 10*3/uL (ref 150.0–400.0)
RBC: 4.87 Mil/uL (ref 3.87–5.11)
RDW: 12.6 % (ref 11.5–15.5)
WBC: 11.3 10*3/uL — ABNORMAL HIGH (ref 4.0–10.5)

## 2022-09-10 LAB — COMPREHENSIVE METABOLIC PANEL
ALT: 17 U/L (ref 0–35)
AST: 17 U/L (ref 0–37)
Albumin: 3.9 g/dL (ref 3.5–5.2)
Alkaline Phosphatase: 66 U/L (ref 39–117)
BUN: 13 mg/dL (ref 6–23)
CO2: 27 mEq/L (ref 19–32)
Calcium: 9.2 mg/dL (ref 8.4–10.5)
Chloride: 104 mEq/L (ref 96–112)
Creatinine, Ser: 0.65 mg/dL (ref 0.40–1.20)
GFR: 116.78 mL/min (ref >60.00)
Glucose, Bld: 104 mg/dL — ABNORMAL HIGH (ref 70–99)
Potassium: 3.9 mEq/L (ref 3.5–5.1)
Sodium: 139 mEq/L (ref 135–145)
Total Bilirubin: 0.3 mg/dL (ref 0.2–1.2)
Total Protein: 7.4 g/dL (ref 6.0–8.3)

## 2022-09-10 LAB — C-REACTIVE PROTEIN: CRP: 3.6 mg/dL (ref 0.5–20.0)

## 2022-09-10 LAB — TSH: TSH: 9.48 u[IU]/mL — ABNORMAL HIGH (ref 0.35–5.50)

## 2022-09-10 MED ORDER — DICYCLOMINE HCL 10 MG PO CAPS: 10.0000 mg | ORAL_CAPSULE | Freq: Three times a day (TID) | ORAL | 2 refills | Status: DC

## 2022-09-10 NOTE — Progress Notes (Signed)
Chief Complaint:   Referring Provider:  Darrol Jump, NP      ASSESSMENT AND PLAN;   #1. GERD  #2. IBS-D. Occ rectal bleeding is concerning  Plan: -CBC, CMP, TSH, celiac, CRP -Stool for GI pathogen, calprotectin -lactose free diet x 2 weeks. -Bentyl 10mg  po QID x 1 month 2RF -Continue protonix 40mg  po BID. -EGD/colon at [facility] (BMI>50). Hold phenteramine 10 days prior   HPI:    Brenda Sawyer is a 31 y.o. female  With OSA, "enlarged heart"but Nl 2DE (EF 55-60% Jan 2023), hypothyroidism, morbid obesity, BPD, PTSD, recurrent bacterial vaginosis, scoliosis.  Bloating after eating x 15 yrs Hearben- panto 40 BID No dyspggia  Diarrhea after eating x >15-20 yrs Occ constiaption rarely More with regular milk- more bloatin  Lower abo pain  Lost 40lb over 3 months  No sodas, chocolates, chewing gums, artificial sweeteners and candy. No NSAIDs.    SH- raising brother's kids  Past Medical History:  Diagnosis Date   Acquired hypothyroidism 10/10/2020   Anxiety    Asthma    Bipolar depression ([place])    Cardiomegaly    Chronic midline thoracic back pain 10/10/2020   Hypothyroidism    Mood disorder ([place]) 01/03/2017   Morbid (severe) obesity with alveolar hypoventilation ([place])    Neck strain, initial encounter 10/10/2020   OSA (obstructive sleep apnea)    PTSD (post-traumatic stress disorder)    Scoliosis    Somnolence    Vitamin D deficiency 10/10/2020    Past Surgical History:  Procedure Laterality Date   NO PAST SURGERIES      Family History  Problem Relation Age of Onset   Depression Mother    Hypothyroidism Father    Hypertension Father    Diabetes Father    Hypercholesterolemia Father    Heart disease Maternal Grandmother    Heart disease Maternal Grandfather    Lung cancer Paternal Grandfather     Social History   Tobacco Use   Smoking status: Former    Packs/day: 0.50    Years: 15.00    Total pack years: 7.50    Types: Cigarettes    Quit  date: 2019    Years since quitting: 4.8   Smokeless tobacco: Never  Vaping Use   Vaping Use: Never used  Substance Use Topics   Alcohol use: Never   Drug use: Never    Current Outpatient Medications  Medication Sig Dispense Refill   albuterol (VENTOLIN HFA) 108 (90 Base) MCG/ACT inhaler Inhale 2 puffs into the lungs every 6 (six) hours as needed for shortness of breath.     Budeson-Glycopyrrol-Formoterol (BREZTRI AEROSPHERE) 160-9-4.8 MCG/ACT AERO Inhale 2 puffs into the lungs daily.     ketoconazole (NIZORAL) 2 % shampoo Apply 1 Application topically 2 (two) times a week.     levothyroxine (SYNTHROID) 175 MCG tablet Take 175 mcg by mouth daily.     montelukast (SINGULAIR) 10 MG tablet Take 10 mg by mouth at bedtime.     norethindrone-ethinyl estradiol-FE (LOESTRIN FE) 1-20 MG-MCG tablet Take 1 tablet by mouth daily.     pantoprazole (PROTONIX) 40 MG tablet Take 40 mg by mouth in the morning and at bedtime.     Semaglutide (RYBELSUS) 3 MG TABS Take 3 mg by mouth daily.     terbinafine (LAMISIL) 250 MG tablet Take 250 mg by mouth daily.     topiramate (TOPAMAX) 100 MG tablet Take 100 mg by mouth 2 (two) times daily.  phentermine (ADIPEX-P) 37.5 MG tablet Take 37.5 mg by mouth daily before breakfast. (Patient not taking: Reported on 09/10/2022)     No current facility-administered medications for this visit.    Allergies  Allergen Reactions   Bactrim [Sulfamethoxazole-Trimethoprim]    Sulfa Antibiotics Hives    Review of Systems:  Constitutional: Denies fever, chills, diaphoresis, appetite change and fatigue.  HEENT: Denies photophobia, eye pain, redness, hearing loss, ear pain, congestion, sore throat, rhinorrhea, sneezing, mouth sores, neck pain, neck stiffness and tinnitus.   Respiratory: Denies SOB, DOE, cough, chest tightness,  and wheezing.   Cardiovascular: Denies chest pain, palpitations and leg swelling.  Genitourinary: Denies dysuria, urgency, frequency, hematuria,  flank pain and difficulty urinating.  Musculoskeletal: Denies myalgias, back pain, joint swelling, arthralgias and gait problem.  Skin: No rash.  Neurological: Denies dizziness, seizures, syncope, weakness, light-headedness, numbness and headaches.  Hematological: Denies adenopathy. Easy bruising, personal or family bleeding history  Psychiatric/Behavioral: No anxiety or depression     Physical Exam:    Pulse 83   Ht 5\' 7"  (1.702 m)   Wt (!) 320 lb (145.2 kg)   BMI 50.12 kg/m  Wt Readings from Last 3 Encounters:  09/10/22 (!) 320 lb (145.2 kg)  08/02/22 (!) 320 lb (145.2 kg)  03/06/22 (!) 354 lb 3.2 oz (160.7 kg)   Constitutional:  Well-developed, in no acute distress. Psychiatric: Normal mood and affect. Behavior is normal. HEENT: Pupils normal.  Conjunctivae are normal. No scleral icterus. Neck supple.  Cardiovascular: Normal rate, regular rhythm. No edema Pulmonary/chest: Effort normal and breath sounds normal. No wheezing, rales or rhonchi. Abdominal: Soft, nondistended. Nontender. Bowel sounds active throughout. There are no masses palpable. No hepatomegaly. Rectal: Deferred Neurological: Alert and oriented to person place and time. Skin: Skin is warm and dry. No rashes noted.  Data Reviewed: I have personally reviewed following labs and imaging studies  CBC:    Latest Ref Rng & Units 06/04/2019    5:03 PM 05/20/2019   10:54 AM 09/01/2012    6:49 PM  CBC  WBC 4.0 - 10.5 K/uL 10.8  10.2  11.3   Hemoglobin 12.0 - 15.0 g/dL 09/03/2012  09.9  83.3   Hematocrit 36.0 - 46.0 % 42.0  41.4  44.2   Platelets 150 - 400 K/uL 233  265  276     CMP:    Latest Ref Rng & Units 06/04/2019    5:03 PM 05/20/2019   10:54 AM 09/01/2012    6:49 PM  CMP  Glucose 70 - 99 mg/dL 91  92  81   BUN 6 - 20 mg/dL 8  11  13    Creatinine 0.44 - 1.00 mg/dL 09/03/2012   0.53   Sodium 135 - 145 mmol/L 136  136  141   Potassium 3.5 - 5.1 mmol/L 4.2  3.8  4.7   Chloride 98 - 111 mmol/L 103  103  107    CO2 22 - 32 mmol/L 23  23  27    Calcium 8.9 - 10.3 mg/dL 9.1  9.1  9.0   Total Protein 6.5 - 8.1 g/dL 7.2  7.0  8.2   Total Bilirubin 0.3 - 1.2 mg/dL 1.0  0.7  0.2   Alkaline Phos 38 - 126 U/L 54  53  87   AST 15 - 41 U/L 48  35  41   ALT 0 - 44 U/L 56  38  50     GFR: CrCl cannot be calculated (Patient's  most recent lab result is older than the maximum 21 days allowed.). Liver Function Tests: No results for input(s): "AST", "ALT", "ALKPHOS", "BILITOT", "PROT", "ALBUMIN" in the last 168 hours. No results for input(s): "LIPASE", "AMYLASE" in the last 168 hours. No results for input(s): "AMMONIA" in the last 168 hours. Coagulation Profile: No results for input(s): "INR", "PROTIME" in the last 168 hours. HbA1C: No results for input(s): "HGBA1C" in the last 72 hours. Lipid Profile: No results for input(s): "CHOL", "HDL", "LDLCALC", "TRIG", "CHOLHDL", "LDLDIRECT" in the last 72 hours. Thyroid Function Tests: No results for input(s): "TSH", "T4TOTAL", "FREET4", "T3FREE", "THYROIDAB" in the last 72 hours. Anemia Panel: No results for input(s): "VITAMINB12", "FOLATE", "FERRITIN", "TIBC", "IRON", "RETICCTPCT" in the last 72 hours.  No results found for this or any previous visit (from the past 240 hour(s)).    Radiology Studies: No results found.    Edman Circle, MD 09/10/2022, 10:41 AM  Cc: Adela Glimpse, NP

## 2022-09-10 NOTE — Patient Instructions (Addendum)
_______________________________________________________  If you are age 32 or older, your body mass index should be between 23-30. Your Body mass index is 50.12 kg/m. If this is out of the aforementioned range listed, please consider follow up with your Primary Care Provider.  If you are age 49 or younger, your body mass index should be between 19-25. Your Body mass index is 50.12 kg/m. If this is out of the aformentioned range listed, please consider follow up with your Primary Care Provider.   ________________________________________________________  The [facility] providers would like to encourage you to use [facility] to communicate with providers for non-urgent requests or questions.  Due to long hold times on the telephone, sending your provider a message by [facility] may be a faster and more efficient way to get a response.  Please allow 48 business hours for a response.  Please remember that this is for non-urgent requests.  _______________________________________________________  Your provider has requested that you go to the basement level for lab work before leaving today. Press "B" on the elevator. The lab is located at the first door on the left as you exit the elevator.  We have sent the following medications to your pharmacy for you to pick up at your convenience: Bentyl 10mg  4 times a day for 1 month  Continue Protonix.  It has been recommended to you by your physician that you have a(n) EGD/Colon at [facility] completed. We do not have a schedule available yet. Please contact our office at [phone] mid November to see if we have a schedule. You may be scheduled for a pre-visit and procedure at that time. But have received blank instructions already  Stop Phentermine 10 days prior to procedure  Lactose free diet for 2 weeks  Lactose-Free Diet, Adult If you have lactose intolerance, you are not able to digest lactose. Lactose is a natural sugar found mainly in dairy  milk and dairy products. A lactose-free diet can help you avoid foods and beverages that contain lactose. What are tips for following this plan? Reading food labels Do not consume foods, beverages, vitamins, minerals, or medicines containing lactose. Read ingredient lists carefully. Look for the words "lactose-free" on labels. Meal planning Use alternatives to dairy milk and foods made with milk products. These include the following: Lactose-free milk. Soy milk with added calcium and vitamin D. Almond milk, coconut milk, rice milk, or other nondairy milk alternatives with added calcium and vitamin D. Note that a lot of these are low in protein. Soy products, such as soy yogurt, soy cheese, soy ice cream, and soy-based sour cream. Other nut milk products, such as almond yogurt, almond cheese, cashew yogurt, cashew cheese, cashew ice cream, coconut yogurt, and coconut ice cream. Medicines, vitamins, and supplements Use lactase enzyme drops or tablets as directed by your health care provider. Make sure you get enough calcium and vitamin D in your diet. A lactose-free eating plan can be lacking in these important nutrients. Take calcium and vitamin D supplements as directed by your health care provider. Talk with your health care provider about supplements if you are not able to get enough calcium and vitamin D from food. What foods should I eat?  Fruits All fresh, canned, frozen, or dried fruits and fruit juices that are not processed with lactose. Vegetables All fresh, frozen, and canned vegetables without cheese, cream, or butter sauces. Grains Any that are not made with dairy milk or dairy products. Meats and other proteins Any meat, fish, poultry, and  other protein sources that are not made with dairy milk or dairy products. Fats and oils Any that are not made with dairy milk or dairy products. Sweets and desserts Any that are not made with dairy milk or dairy products. Seasonings and  condiments Any that are not made with dairy milk or dairy products. Calcium Calcium is found in many foods that contain lactose and is important for bone health. The amount of calcium you need depends on your age: Adults younger than 50 years: 1,000 mg of calcium a day. Adults older than 50 years: 1,200 mg of calcium a day. If you are not getting enough calcium, you may get it from other sources, including: Orange juice that has been fortified with calcium. This means that calcium has been added to the product. There are 300-350 mg of calcium in 1 cup (237 mL) of calcium-fortified orange juice. Soy milk fortified with calcium. There are 300-400 mg of calcium in 1 cup (237 mL) of calcium-fortified soy milk. Rice or almond milk fortified with calcium. There are 300 mg of calcium in 1 cup (237 mL) of calcium-fortified rice or almond milk. Breakfast cereals fortified with calcium. There are 100-1,000 mg of calcium in calcium-fortified breakfast cereals. Spinach, cooked. There are 145 mg of calcium in  cup (90 g) of cooked spinach. Edamame, cooked. There are 130 mg of calcium in  cup (47 g) of cooked edamame. Collard greens, cooked. There are 125 mg of calcium in  cup (85 g) of cooked collard greens. Kale, frozen or cooked. There are 90 mg of calcium in  cup (59 g) of cooked or frozen kale. Almonds. There are 95 mg of calcium in  cup (35 g) of almonds. Broccoli, cooked. There are 60 mg of calcium in 1 cup (156 g) of cooked broccoli. The items listed above may not be a complete list of foods and beverages you can eat. Contact a dietitian for more options. What foods should I avoid? Lactose is found in dairy milk and dairy products, such as: Yogurt. Cheese. Butter. Margarine. Sour cream. Cream. Whipped toppings and creamers. Ice cream and other dairy-based desserts. Lactose is also found in foods or products made with dairy milk or milk ingredients. To find out whether a food contains dairy  milk or a milk ingredient, look at the ingredients list. Avoid foods with the statement "May contain milk" and foods that contain: Milk powder. Whey. Curd. Lactose. Lactoglobulin. The items listed above may not be a complete list of foods and beverages to avoid. Contact a dietitian for more information. Where to find more information [employer] of Diabetes and Digestive and Kidney Diseases: [employer] Summary If you are lactose intolerant, it means that you are not able to digest lactose, a natural sugar found in milk and milk products. Following a lactose-free diet can help you manage this condition. Calcium is important for bone health and is found in many foods that contain lactose. Talk with your health care provider about other sources of calcium. This information is not intended to replace advice given to you by your health care provider. Make sure you discuss any questions you have with your health care provider. Document Revised: 10/11/2020 Document Reviewed: 10/11/2020 Elsevier Patient Education  [place] you,  Dr. Jackquline Denmark

## 2022-09-11 ENCOUNTER — Other Ambulatory Visit: Payer: Medicaid Other

## 2022-09-11 DIAGNOSIS — K625 Hemorrhage of anus and rectum: Secondary | ICD-10-CM

## 2022-09-11 DIAGNOSIS — K580 Irritable bowel syndrome with diarrhea: Secondary | ICD-10-CM

## 2022-09-11 DIAGNOSIS — K219 Gastro-esophageal reflux disease without esophagitis: Secondary | ICD-10-CM

## 2022-09-12 LAB — CELIAC PANEL 10
Antigliadin Abs, IgA: 4 units (ref 0–19)
Endomysial IgA: NEGATIVE
Gliadin IgG: 1 units (ref 0–19)
IgA/Immunoglobulin A, Serum: 163 mg/dL (ref 87–352)
Tissue Transglut Ab: <2 U/mL (ref 0–5)
Transglutaminase IgA: <2 U/mL (ref 0–3)

## 2022-09-14 LAB — CALPROTECTIN, FECAL: Calprotectin, Fecal: 26 ug/g (ref 0–120)

## 2022-09-15 LAB — GI PROFILE, STOOL, PCR

## 2022-11-02 DIAGNOSIS — G4733 Obstructive sleep apnea (adult) (pediatric): Secondary | ICD-10-CM | POA: Insufficient documentation

## 2022-11-06 DIAGNOSIS — R7989 Other specified abnormal findings of blood chemistry: Secondary | ICD-10-CM | POA: Insufficient documentation

## 2022-11-26 ENCOUNTER — Telehealth: Payer: Self-pay

## 2022-11-26 DIAGNOSIS — Z9889 Other specified postprocedural states: Secondary | ICD-10-CM | POA: Insufficient documentation

## 2022-11-26 NOTE — Telephone Encounter (Signed)
Patient returned call

## 2022-11-26 NOTE — Telephone Encounter (Signed)
LVM  for patient to call back to see about schedule an EGD/Colon at [facility] in March. Home number is her mom's number

## 2022-11-27 NOTE — Telephone Encounter (Signed)
Spoke with patient and offered her March 18th but she can't do it due to not have child care the day of the procedure. She has a mandatory meeting tomorrow that she has childcare for but not for the 18th. She told she has her gallbladder taken out right before christmas   Will get back with her regarding this

## 2022-11-27 NOTE — Telephone Encounter (Signed)
Spoke with patient and she will try for this.

## 2022-11-27 NOTE — Telephone Encounter (Signed)
Spoke to patient. She will have her previsit by telephone on 1-31 at 11am with nurse. Procedure schedule for 3-18 arrival time at 815am for a 945 am procedure at [facility]. She is aware and have access to mychart. Patient on phentermine.

## 2022-11-27 NOTE — Addendum Note (Signed)
Addended by: Curlene Labrum E on: 11/27/2022 01:36 PM   Modules accepted: Orders

## 2022-12-12 ENCOUNTER — Telehealth: Payer: Self-pay | Admitting: *Deleted

## 2022-12-12 NOTE — Telephone Encounter (Signed)
Yesi,  This pt's BMI is greater than 50; their procedure will need to be performed at the hospital.  Thanks,  Allisa Einspahr 

## 2022-12-13 NOTE — Telephone Encounter (Signed)
Scheduled at [facility] 02-04-23 w/Dr Lyndel Safe.

## 2022-12-19 ENCOUNTER — Telehealth: Payer: Self-pay | Admitting: *Deleted

## 2022-12-19 ENCOUNTER — Ambulatory Visit (AMBULATORY_SURGERY_CENTER): Payer: Medicaid Other | Admitting: *Deleted

## 2022-12-19 VITALS — Ht 67.0 in | Wt 320.0 lb

## 2022-12-19 DIAGNOSIS — K2100 Gastro-esophageal reflux disease with esophagitis, without bleeding: Secondary | ICD-10-CM

## 2022-12-19 DIAGNOSIS — K625 Hemorrhage of anus and rectum: Secondary | ICD-10-CM

## 2022-12-19 DIAGNOSIS — K580 Irritable bowel syndrome with diarrhea: Secondary | ICD-10-CM

## 2022-12-19 MED ORDER — PLENVU 140 G PO SOLR: 1.0000 | ORAL | 0 refills | Status: DC

## 2022-12-19 NOTE — Progress Notes (Signed)
No egg or soy allergy known to patient  No issues known to pt with past sedation with any surgeries or procedures Patient denies ever being told they had issues or difficulty with intubation  No FH of Malignant Hyperthermia Pt is not on diet pills Pt is not on  home 02  Pt is not on blood thinners  Pt denies issues with constipation  Pt is not on dialysis Pt denies any upcoming cardiac testing Pt encouraged to use to use Singlecare or Goodrx to reduce cost  Patient's chart reviewed by Osvaldo Angst CNRA prior to previsit and patient appropriate for the [place].  Previsit completed and red dot placed by patient's name on their procedure day (on provider's schedule).  . Pt states she can only use coban and tegaderm allergic to all other kinds of taper even bandaids Visit by phone Instructions reviewed with pt and pt states understanding. Instructed to review again prior to procedure. Pt states they will.  Instructions sent by mail and by my chart

## 2022-12-19 NOTE — Telephone Encounter (Signed)
Called stared # on phone list. Unable to reach. LM with call back #

## 2022-12-28 NOTE — Telephone Encounter (Signed)
k

## 2023-01-22 ENCOUNTER — Ambulatory Visit: Payer: Medicaid Other | Admitting: Allergy

## 2023-01-22 ENCOUNTER — Encounter: Payer: Self-pay | Admitting: Allergy

## 2023-01-22 VITALS — BP 124/84 | HR 90 | Resp 20 | Ht 67.0 in | Wt 331.4 lb

## 2023-01-22 DIAGNOSIS — J4540 Moderate persistent asthma, uncomplicated: Secondary | ICD-10-CM

## 2023-01-22 DIAGNOSIS — H109 Unspecified conjunctivitis: Secondary | ICD-10-CM

## 2023-01-22 DIAGNOSIS — J310 Chronic rhinitis: Secondary | ICD-10-CM

## 2023-01-22 DIAGNOSIS — H1013 Acute atopic conjunctivitis, bilateral: Secondary | ICD-10-CM

## 2023-01-22 MED ORDER — IPRATROPIUM-ALBUTEROL 0.5-2.5 (3) MG/3ML IN SOLN
3.0000 mL | Freq: Once | RESPIRATORY_TRACT | Status: AC
  Administered 2023-01-22: 3 mL via RESPIRATORY_TRACT

## 2023-01-22 MED ORDER — CETIRIZINE HCL 10 MG PO TABS: ORAL_TABLET | ORAL | 5 refills | Status: DC

## 2023-01-22 MED ORDER — DYMISTA 137-50 MCG/ACT NA SUSP: NASAL | 5 refills | Status: DC

## 2023-01-22 MED ORDER — CROMOLYN SODIUM 4 % OP SOLN: OPHTHALMIC | 5 refills | Status: DC

## 2023-01-22 MED ORDER — FLUTICASONE PROPIONATE HFA 110 MCG/ACT IN AERO: INHALATION_SPRAY | RESPIRATORY_TRACT | 5 refills | Status: DC

## 2023-01-22 MED ORDER — AEROCHAMBER MV MISC: 2 refills | Status: AC

## 2023-01-22 NOTE — Patient Instructions (Addendum)
Upper respiratory illness with asthma flare - take medications as recommended by UC (antibiotic, prednisone course) - use albuterol inhaler or nebulizer every 4 hours while awake for next 2 days then return to as needed use - get adequate rest and adequate hydration  Asthma - Daily controller medication(s):  Breztri 2 puffs twice a day with spacer device.  Singulair '10mg'$  daily at bedtime - Prior to physical activity: albuterol 2 puffs 10-15 minutes before physical activity. - Rescue medications: albuterol 2 puffs every 4-6 hours as needed and albuterol nebulizer one vial every 4-6 hours as needed - Changes during respiratory infections or worsening symptoms: Add on generic Flovent (Fluticasone) 171mg  2 puffs  2 times daily for TWO WEEKS. - Asthma control goals:  * Full participation in all desired activities (may need albuterol before activity) * Albuterol use two time or less a week on average (not counting use with activity) * Cough interfering with sleep two time or less a month * Oral steroids no more than once a year * No hospitalizations  Allergies - will obtain environmental allergy panel and CBC for eosinophils with labwork - start Zyrtec '10mg'$  daily.  This is a antihistamine.  - start Cromolyn eye drop 1-2 drop each eye up to 4 times a day as needed for itchy/watery eyes - start Dymista 1 spray each nostril twice a day for runny or stuffy onse as needed  Follow-up in 3-4 months or sooner if needed

## 2023-01-22 NOTE — Progress Notes (Signed)
New Patient Note  RE: Brenda Sawyer MRN: [id number] DOB: [birth year] Date of Office Visit: 01/22/2023   Primary care provider: Martin Majestic, FNP  Chief Complaint: asthma, allergies  History of present illness: Brenda Sawyer is a 32 y.o. female presenting today for evaluation of asthma and allergic rhinitis.   She has history of asthma and states was diagnosed around 21-21yo.  She states she has shortness of breath, chest tightness.  She states she is always wheezing.  She also reports coughing.  She states she is coughing up "green goo".  She states currently has a URI that has flared her asthma.  She has had symptoms for past 2 days with URI from the UC.  She states the UC provider told her she was wheezing.  She was prescribed antibiotic (she is not sure which), steroid (prednisone), cough syrup and recommended to use nebulizer.  She has not started any of these medications yet.  She states she may need steroid at least twice a year.   She is on Breztri for past few months and states it works better than the other inhalers she has tried.   She has been on singulair for past year.  She also sees [place] [facility] (Dr Carren Rang) as well as she also has OSA.    She reports itchy/watery eyes, nasal congestion, dry itchy skin.  These symptoms are worse in the winter and fall.  She is not taking any antihistamines, nose sprays or eye drops for these symptoms at this time.   She had her gallbladder removed 3 days before Christmas.  She states this has helped with improving her acid reflux.  She states her gallbladder was full of stones.  She states she was able to stop taking her reflux medication.  On 3/15 she is having upper and lower endoscopy.   She does states that dairy makes her have loose watery stools.    Review of systems: Review of Systems  Constitutional: Negative.   HENT:  Positive for congestion.   Eyes:  Positive for itching.  Respiratory:  Positive for cough, shortness of  breath and wheezing.   Cardiovascular: Negative.   Gastrointestinal: Negative.   Musculoskeletal: Negative.   Skin: Negative.   Allergic/Immunologic: Negative.   Neurological: Negative.     All other systems negative unless noted above in HPI  Past medical history: Past Medical History:  Diagnosis Date   Acquired hypothyroidism 10/10/2020   Allergy    Anxiety    Arthritis    Asthma    Bipolar depression ([place])    Cardiomegaly    Chronic midline thoracic back pain 10/10/2020   Depression    HIV infection ([place])    Hypothyroidism    Mood disorder ([place]) 01/03/2017   Morbid (severe) obesity with alveolar hypoventilation ([place])    Neck strain, initial encounter 10/10/2020   OSA (obstructive sleep apnea)    PTSD (post-traumatic stress disorder)    Scoliosis    Sleep apnea    Somnolence    Vitamin D deficiency 10/10/2020    Past surgical history: Past Surgical History:  Procedure Laterality Date   CHOLECYSTECTOMY     NO PAST SURGERIES      Family history:  Family History  Problem Relation Age of Onset   Depression Mother    Hypothyroidism Father    Hypertension Father    Diabetes Father    Hypercholesterolemia Father    Heart disease Maternal Grandmother    Heart disease Maternal Grandfather  Lung cancer Paternal Grandfather    Colon cancer Neg Hx    Colon polyps Neg Hx    Esophageal cancer Neg Hx    Rectal cancer Neg Hx    Stomach cancer Neg Hx     Social history: Lives in a dual resident with a house and an apartment.  No carpeting in the residences.  Theres gas and electric heating with central and window cooling.  Cats, dogs and fish in the home.  No concern for water damage, mildew or roaches in the home.  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  Tobacco Use   Smoking status: Former    Packs/day: 0.50    Years: 15.00    Total pack years: 7.50    Types: Cigarettes    Quit date: 2019    Years since quitting: 5.1   Smokeless tobacco: Never  Vaping  Use   Vaping Use: Never used     Medication List: Current Outpatient Medications  Medication Sig Dispense Refill   albuterol (VENTOLIN HFA) 108 (90 Base) MCG/ACT inhaler Inhale 2 puffs into the lungs every 6 (six) hours as needed for shortness of breath.     Budeson-Glycopyrrol-Formoterol (BREZTRI AEROSPHERE) 160-9-4.8 MCG/ACT AERO Inhale 2 puffs into the lungs daily.     cetirizine (ZYRTEC) 10 MG tablet Take one tablet once daily 30 tablet 5   cromolyn (OPTICROM) 4 % ophthalmic solution Use 1-2 drops in each eye up to 4 times daily as needed for itchy/watery eyes 10 mL 5   DYMISTA 137-50 MCG/ACT SUSP Use one spray in each nostril twice daily for runny or stuffy nose. 23 g 5   fluticasone (FLOVENT HFA) 110 MCG/ACT inhaler Inhale two puffs twice daily to prevent cough or wheeze. Rinse mouth after use. 12 g 5   levothyroxine (SYNTHROID) 175 MCG tablet Take 200 mcg by mouth daily.     montelukast (SINGULAIR) 10 MG tablet Take 10 mg by mouth at bedtime.     norethindrone-ethinyl estradiol-FE (LOESTRIN FE) 1-20 MG-MCG tablet Take 1 tablet by mouth daily.     phentermine (ADIPEX-P) 37.5 MG tablet Take 37.5 mg by mouth daily before breakfast.     Spacer/Aero-Holding Chambers (AEROCHAMBER MV) inhaler Use as instructed 1 each 2   topiramate (TOPAMAX) 100 MG tablet Take 100 mg by mouth 2 (two) times daily.     No current facility-administered medications for this visit.    Known medication allergies: Allergies  Allergen Reactions   Other Itching    Tide laundry detergent   Cinnamon Other (See Comments)    Artificial cinnamon: Make mouth raw   Sulfa Antibiotics Hives    All Sulfa drugs   Wound Dressing Adhesive Rash   Bactrim [Sulfamethoxazole-Trimethoprim]      Physical examination: Blood pressure 124/84, pulse 90, resp. rate 20, height '5\' 7"'$  (1.702 m), weight (!) 331 lb 6.4 oz (150.3 kg), SpO2 96 %, unknown if currently breastfeeding.  General: Alert, interactive, in no acute  distress. HEENT: PERRLA, TMs pearly gray, turbinates moderately edematous without discharge, post-pharynx non erythematous. Neck: Supple without lymphadenopathy. Lungs: Mildly decreased breath sounds bilaterally without wheezing, rhonchi or rales. {no increased work of breathing. CV: Normal S1, S2 without murmurs. Abdomen: Nondistended, nontender. Skin: Warm and dry, without lesions or rashes. Extremities:  No clubbing, cyanosis or edema. Neuro:   Grossly intact.  Diagnositics/Labs:  Spirometry: FEV1: 1.32L 38%, FVC: 2.09L 50% predicted.  Pt gave very poor effort and stated "this is a bad day for this".  Duoneb given in office with a 29% increase in FEV1 which is significant to 1.7L 49% predicted.    Allergy testing: deferred due to ongoing URI  Assessment and plan:   Upper respiratory illness with asthma flare - take medications as recommended by UC (antibiotic, prednisone course) - use albuterol inhaler or nebulizer every 4 hours while awake for next 2 days then return to as needed use - get adequate rest and adequate hydration  Asthma - Daily controller medication(s):  Breztri 2 puffs twice a day with spacer device.  Singulair '10mg'$  daily at bedtime - Prior to physical activity: albuterol 2 puffs 10-15 minutes before physical activity. - Rescue medications: albuterol 2 puffs every 4-6 hours as needed and albuterol nebulizer one vial every 4-6 hours as needed - Changes during respiratory infections or worsening symptoms: Add on generic Flovent (Fluticasone) 162mg  2 puffs  2 times daily for TWO WEEKS. - Asthma control goals:  * Full participation in all desired activities (may need albuterol before activity) * Albuterol use two time or less a week on average (not counting use with activity) * Cough interfering with sleep two time or less a month * Oral steroids no more than once a year * No hospitalizations  Rhinoconjunctivitis - will obtain environmental allergy panel and CBC  for eosinophils with labwork - start Zyrtec '10mg'$  daily.  This is a antihistamine.  - start Cromolyn eye drop 1-2 drop each eye up to 4 times a day as needed for itchy/watery eyes - start Dymista 1 spray each nostril twice a day for runny or stuffy onse as needed  Follow-up in 3-4 months or sooner if needed  I appreciate the opportunity to take part in [facility]. Please do not hesitate to contact me with questions.  Sincerely,   SPrudy Feeler MD Allergy/Immunology [facility]

## 2023-01-27 LAB — ALLERGENS W/TOTAL IGE AREA 2

## 2023-01-27 LAB — CBC WITH DIFFERENTIAL/PLATELET
Basophils Absolute: 0 10*3/uL (ref 0.0–0.2)
Basos: 0 %
EOS (ABSOLUTE): 0.1 10*3/uL (ref 0.0–0.4)
Eos: 2 %
Hematocrit: 41.6 % (ref 34.0–46.6)
Hemoglobin: 14.4 g/dL (ref 11.1–15.9)
Immature Grans (Abs): 0.1 10*3/uL (ref 0.0–0.1)
Immature Granulocytes: 1 %
Lymphocytes Absolute: 2.2 10*3/uL (ref 0.7–3.1)
Lymphs: 24 %
MCH: 31.4 pg (ref 26.6–33.0)
MCHC: 34.6 g/dL (ref 31.5–35.7)
MCV: 91 fL (ref 79–97)
Monocytes Absolute: 0.4 10*3/uL (ref 0.1–0.9)
Monocytes: 4 %
Neutrophils Absolute: 6.5 10*3/uL (ref 1.4–7.0)
Neutrophils: 69 %
Platelets: 246 10*3/uL (ref 150–450)
RBC: 4.58 x10E6/uL (ref 3.77–5.28)
RDW: 12.5 % (ref 11.7–15.4)
WBC: 9.4 10*3/uL (ref 3.4–10.8)

## 2023-01-28 ENCOUNTER — Encounter (HOSPITAL_COMMUNITY): Payer: Self-pay | Admitting: Gastroenterology

## 2023-02-04 ENCOUNTER — Ambulatory Visit (HOSPITAL_COMMUNITY): Payer: Medicaid Other | Admitting: Certified Registered"

## 2023-02-04 ENCOUNTER — Encounter (HOSPITAL_COMMUNITY): Payer: Self-pay | Admitting: Gastroenterology

## 2023-02-04 ENCOUNTER — Ambulatory Visit (HOSPITAL_COMMUNITY)
Admission: RE | Admit: 2023-02-04 | Discharge: 2023-02-04 | Disposition: A | Discharge status: Home / Self Care | Payer: Medicaid Other | Attending: Gastroenterology | Admitting: Gastroenterology

## 2023-02-04 ENCOUNTER — Other Ambulatory Visit: Payer: Self-pay

## 2023-02-04 ENCOUNTER — Ambulatory Visit (HOSPITAL_BASED_OUTPATIENT_CLINIC_OR_DEPARTMENT_OTHER): Payer: Medicaid Other | Admitting: Certified Registered"

## 2023-02-04 ENCOUNTER — Encounter (HOSPITAL_COMMUNITY)
Admission: RE | Disposition: A | Discharge status: Home / Self Care | Payer: Self-pay | Source: Home / Self Care | Attending: Gastroenterology

## 2023-02-04 DIAGNOSIS — K635 Polyp of colon: Secondary | ICD-10-CM

## 2023-02-04 DIAGNOSIS — E039 Hypothyroidism, unspecified: Secondary | ICD-10-CM | POA: Insufficient documentation

## 2023-02-04 DIAGNOSIS — G4733 Obstructive sleep apnea (adult) (pediatric): Secondary | ICD-10-CM | POA: Insufficient documentation

## 2023-02-04 DIAGNOSIS — K625 Hemorrhage of anus and rectum: Secondary | ICD-10-CM

## 2023-02-04 DIAGNOSIS — F4310 Post-traumatic stress disorder, unspecified: Secondary | ICD-10-CM | POA: Diagnosis not present

## 2023-02-04 DIAGNOSIS — K2950 Unspecified chronic gastritis without bleeding: Secondary | ICD-10-CM | POA: Insufficient documentation

## 2023-02-04 DIAGNOSIS — Z79899 Other long term (current) drug therapy: Secondary | ICD-10-CM | POA: Diagnosis not present

## 2023-02-04 DIAGNOSIS — D124 Benign neoplasm of descending colon: Secondary | ICD-10-CM | POA: Insufficient documentation

## 2023-02-04 DIAGNOSIS — N760 Acute vaginitis: Secondary | ICD-10-CM | POA: Insufficient documentation

## 2023-02-04 DIAGNOSIS — K52839 Microscopic colitis, unspecified: Secondary | ICD-10-CM

## 2023-02-04 DIAGNOSIS — Z01812 Encounter for preprocedural laboratory examination: Secondary | ICD-10-CM

## 2023-02-04 DIAGNOSIS — K5730 Diverticulosis of large intestine without perforation or abscess without bleeding: Secondary | ICD-10-CM | POA: Insufficient documentation

## 2023-02-04 DIAGNOSIS — M419 Scoliosis, unspecified: Secondary | ICD-10-CM | POA: Insufficient documentation

## 2023-02-04 DIAGNOSIS — K640 First degree hemorrhoids: Secondary | ICD-10-CM | POA: Diagnosis not present

## 2023-02-04 DIAGNOSIS — Z87891 Personal history of nicotine dependence: Secondary | ICD-10-CM | POA: Insufficient documentation

## 2023-02-04 DIAGNOSIS — Z09 Encounter for follow-up examination after completed treatment for conditions other than malignant neoplasm: Secondary | ICD-10-CM | POA: Insufficient documentation

## 2023-02-04 DIAGNOSIS — K580 Irritable bowel syndrome with diarrhea: Secondary | ICD-10-CM | POA: Insufficient documentation

## 2023-02-04 DIAGNOSIS — Z21 Asymptomatic human immunodeficiency virus [HIV] infection status: Secondary | ICD-10-CM | POA: Insufficient documentation

## 2023-02-04 DIAGNOSIS — J45909 Unspecified asthma, uncomplicated: Secondary | ICD-10-CM

## 2023-02-04 DIAGNOSIS — Z6841 Body Mass Index (BMI) 40.0 and over, adult: Secondary | ICD-10-CM | POA: Diagnosis not present

## 2023-02-04 DIAGNOSIS — K219 Gastro-esophageal reflux disease without esophagitis: Secondary | ICD-10-CM | POA: Insufficient documentation

## 2023-02-04 DIAGNOSIS — M1990 Unspecified osteoarthritis, unspecified site: Secondary | ICD-10-CM

## 2023-02-04 HISTORY — DX: Gastro-esophageal reflux disease without esophagitis: K21.9

## 2023-02-04 HISTORY — PX: COLONOSCOPY WITH PROPOFOL: SHX5780

## 2023-02-04 HISTORY — DX: Irritable bowel syndrome with diarrhea: K58.0

## 2023-02-04 HISTORY — DX: Hemorrhage of anus and rectum: K62.5

## 2023-02-04 HISTORY — PX: ESOPHAGOGASTRODUODENOSCOPY (EGD) WITH PROPOFOL: SHX5813

## 2023-02-04 HISTORY — PX: BIOPSY: SHX5522

## 2023-02-04 HISTORY — PX: POLYPECTOMY: SHX5525

## 2023-02-04 LAB — PREGNANCY, URINE: Preg Test, Ur: NEGATIVE

## 2023-02-04 SURGERY — COLONOSCOPY WITH PROPOFOL
Anesthesia: Monitor Anesthesia Care

## 2023-02-04 MED ORDER — LIDOCAINE 2% (20 MG/ML) 5 ML SYRINGE
INTRAMUSCULAR | Status: DC | PRN
  Administered 2023-02-04: 80 mg via INTRAVENOUS

## 2023-02-04 MED ORDER — LACTATED RINGERS IV SOLN: INTRAVENOUS | Status: DC

## 2023-02-04 MED ORDER — SODIUM CHLORIDE 0.9 % IV SOLN: INTRAVENOUS | Status: DC

## 2023-02-04 MED ORDER — PROPOFOL 500 MG/50ML IV EMUL
INTRAVENOUS | Status: DC | PRN
  Administered 2023-02-04: 125 ug/kg/min via INTRAVENOUS

## 2023-02-04 MED ORDER — PROPOFOL 10 MG/ML IV BOLUS
INTRAVENOUS | Status: DC | PRN
  Administered 2023-02-04 (×2): 20 mg via INTRAVENOUS
  Administered 2023-02-04: 30 mg via INTRAVENOUS
  Administered 2023-02-04: 20 mg via INTRAVENOUS
  Administered 2023-02-04 (×2): 30 mg via INTRAVENOUS

## 2023-02-04 MED ORDER — PROPOFOL 1000 MG/100ML IV EMUL
INTRAVENOUS | Status: AC
  Filled 2023-02-04: qty 100

## 2023-02-04 SURGICAL SUPPLY — 25 items

## 2023-02-04 NOTE — Op Note (Signed)
[facility] Patient Name: Brenda Sawyer Procedure Date: 02/04/2023 MRN: [id number] Attending MD: Jackquline Denmark , MD, [id number] Date of Birth: 1991/06/12 CSN: [id number] Age: 32 Admit Type: Outpatient Procedure:                Upper GI endoscopy Indications:              Epigastric abdominal pain. GERD Providers:                Jackquline Denmark, MD, Mikey College, RN, Brien Mates, Technician Referring MD:              Medicines:                Monitored Anesthesia Care Complications:            No immediate complications. Estimated Blood Loss:     Estimated blood loss: none. Procedure:                Pre-Anesthesia Assessment:                           - Prior to the procedure, a History and Physical                            was performed, and patient medications and                            allergies were reviewed. The patient's tolerance of                            previous anesthesia was also reviewed. The risks                            and benefits of the procedure and the sedation                            options and risks were discussed with the patient.                            All questions were answered, and informed consent                            was obtained. Prior Anticoagulants: The patient has                            taken no anticoagulant or antiplatelet agents. ASA                            Grade Assessment: II - A patient with mild systemic                            disease. After reviewing the risks and benefits,  the patient was deemed in satisfactory condition to                            undergo the procedure.                           After obtaining informed consent, the endoscope was                            passed under direct vision. Throughout the                            procedure, the patient's blood pressure, pulse, and                            oxygen  saturations were monitored continuously. The                            [id number] Olympus endoscope was introduced                            through the mouth, and advanced to the second part                            of duodenum. The upper GI endoscopy was                            accomplished without difficulty. The patient                            tolerated the procedure well. Scope In: Scope Out: Findings:      The examined esophagus was normal with well-defined Z-line at 40 cm.       Biopsies were obtained from the proximal and distal esophagus with cold       forceps for histology to r/o eosinophilic esophagitis.      Localized minimal inflammation characterized by erythema was found in       the gastric antrum. Biopsies were taken with a cold forceps for       histology.      The examined duodenum was normal. Biopsies for histology were taken with       a cold forceps for evaluation of celiac disease. Impression:               - Mild gastritis. Moderate Sedation:      Not Applicable - Patient had care per Anesthesia. Recommendation:           - Patient has a contact number available for                            emergencies. The signs and symptoms of potential                            delayed complications were discussed with the  patient. Return to normal activities tomorrow.                            Written discharge instructions were provided to the                            patient.                           - Resume previous diet.                           - Continue present medications. Decrease                            pantoprazole 40 mg p.o. once a day.                           - Await pathology results.                           - If still with problems, further workup.                           - The findings and recommendations were discussed                            with the patient's family. Procedure Code(s):         --- Professional ---                           [phone], Esophagogastroduodenoscopy, flexible,                            transoral; with biopsy, single or multiple Diagnosis Code(s):        --- Professional ---                           K29.70, Gastritis, unspecified, without bleeding                           R10.13, Epigastric pain CPT copyright 2022 American Medical Association. All rights reserved. The codes documented in this report are preliminary and upon coder review may  be revised to meet current compliance requirements. Jackquline Denmark, MD 02/04/2023 10:02:45 AM This report has been signed electronically. Number of Addenda: 0

## 2023-02-04 NOTE — Discharge Instructions (Signed)
YOU HAD AN ENDOSCOPIC PROCEDURE TODAY: Refer to the procedure report and other information in the discharge instructions given to you for any specific questions about what was found during the examination. If this information does not answer your questions, please call [place] office at [phone] to clarify.  ° °YOU SHOULD EXPECT: Some feelings of bloating in the abdomen. Passage of more gas than usual. Walking can help get rid of the air that was put into your GI tract during the procedure and reduce the bloating. If you had a lower endoscopy (such as a colonoscopy or flexible sigmoidoscopy) you may notice spotting of blood in your stool or on the toilet paper. Some abdominal soreness may be present for a day or two, also. ° °DIET: Your first meal following the procedure should be a light meal and then it is ok to progress to your normal diet. A half-sandwich or bowl of soup is an example of a good first meal. Heavy or fried foods are harder to digest and may make you feel nauseous or bloated. Drink plenty of fluids but you should avoid alcoholic beverages for 24 hours. If you had a esophageal dilation, please see attached instructions for diet.   ° °ACTIVITY: Your care partner should take you home directly after the procedure. You should plan to take it easy, moving slowly for the rest of the day. You can resume normal activity the day after the procedure however YOU SHOULD NOT DRIVE, use power tools, machinery or perform tasks that involve climbing or major physical exertion for 24 hours (because of the sedation medicines used during the test).  ° °SYMPTOMS TO REPORT IMMEDIATELY: °A gastroenterologist can be reached at any hour. Please call [phone]  for any of the following symptoms:  °Following lower endoscopy (colonoscopy, flexible sigmoidoscopy) °Excessive amounts of blood in the stool  °Significant tenderness, worsening of abdominal pains  °Swelling of the abdomen that is new, acute  °Fever of 100° or  higher  °Following upper endoscopy (EGD, EUS, ERCP, esophageal dilation) °Vomiting of blood or coffee ground material  °New, significant abdominal pain  °New, significant chest pain or pain under the shoulder blades  °Painful or persistently difficult swallowing  °New shortness of breath  °Black, tarry-looking or red, bloody stools ° °FOLLOW UP:  °If any biopsies were taken you will be contacted by phone or by letter within the next 1-3 weeks. Call [phone]  if you have not heard about the biopsies in 3 weeks.  °Please also call with any specific questions about appointments or follow up tests. ° °

## 2023-02-04 NOTE — Transfer of Care (Signed)
Immediate Anesthesia Transfer of Care Note  Patient: Brenda Sawyer  Procedure(s) Performed: COLONOSCOPY WITH PROPOFOL ESOPHAGOGASTRODUODENOSCOPY (EGD) WITH PROPOFOL BIOPSY POLYPECTOMY  Patient Location: PACU  Anesthesia Type:MAC  Level of Consciousness: awake, alert , and oriented  Airway & Oxygen Therapy: Patient Spontanous Breathing and Patient connected to face mask oxygen  Post-op Assessment: Report given to RN and Post -op Vital signs reviewed and stable  Post vital signs: Reviewed and stable  Last Vitals:  Vitals Value Taken Time  BP    Temp    Pulse 77 02/04/23 1004  Resp 11 02/04/23 1004  SpO2 100 % 02/04/23 1004  Vitals shown include unvalidated device data.  Last Pain:  Vitals:   02/04/23 0902  TempSrc: Temporal  PainSc: 7          Complications: No notable events documented.

## 2023-02-04 NOTE — H&P (Signed)
Chief Complaint:    Referring Provider:  Darrol Jump, NP        ASSESSMENT AND PLAN;    #1. GERD   #2. IBS-D. Occ rectal bleeding is concerning   Plan: -CBC, CMP, TSH, celiac, CRP -Stool for GI pathogen, calprotectin -lactose free diet x 2 weeks. -Bentyl 10mg  po QID x 1 month 2RF -Continue protonix 40mg  po BID. -EGD/colon at [facility] (BMI>50). Hold phenteramine 10 days prior    For EGD/colon today. Explained risks and benefits.     HPI:     Brenda Sawyer is a 32 y.o. female  With OSA, "enlarged heart"but Nl 2DE (EF 55-60% Jan 2023), hypothyroidism, morbid obesity, BPD, PTSD, recurrent bacterial vaginosis, scoliosis.   Bloating after eating x 15 yrs Heartburn- panto 40 BID No dyspggia   Diarrhea after eating x >15-20 yrs Occ constiaption rarely More with regular milk- more bloatin   Lower abo pain   Lost 40lb over 3 months   No sodas, chocolates, chewing gums, artificial sweeteners and candy. No NSAIDs.      SH- raising brother's kids      Past Medical History:  Diagnosis Date   Acquired hypothyroidism 10/10/2020   Anxiety     Asthma     Bipolar depression ([place])     Cardiomegaly     Chronic midline thoracic back pain 10/10/2020   Hypothyroidism     Mood disorder ([place]) 01/03/2017   Morbid (severe) obesity with alveolar hypoventilation ([place])     Neck strain, initial encounter 10/10/2020   OSA (obstructive sleep apnea)     PTSD (post-traumatic stress disorder)     Scoliosis     Somnolence     Vitamin D deficiency 10/10/2020           Past Surgical History:  Procedure Laterality Date   NO PAST SURGERIES               Family History  Problem Relation Age of Onset   Depression Mother     Hypothyroidism Father     Hypertension Father     Diabetes Father     Hypercholesterolemia Father     Heart disease Maternal Grandmother     Heart disease Maternal Grandfather     Lung cancer Paternal Grandfather        Social History          Tobacco Use   Smoking status: Former      Packs/day: 0.50      Years: 15.00      Total pack years: 7.50      Types: Cigarettes      Quit date: 2019      Years since quitting: 4.8   Smokeless tobacco: Never  Vaping Use   Vaping Use: Never used  Substance Use Topics   Alcohol use: Never   Drug use: Never            Current Outpatient Medications  Medication Sig Dispense Refill   albuterol (VENTOLIN HFA) 108 (90 Base) MCG/ACT inhaler Inhale 2 puffs into the lungs every 6 (six) hours as needed for shortness of breath.       Budeson-Glycopyrrol-Formoterol (BREZTRI AEROSPHERE) 160-9-4.8 MCG/ACT AERO Inhale 2 puffs into the lungs daily.       ketoconazole (NIZORAL) 2 % shampoo Apply 1 Application topically 2 (two) times a week.       levothyroxine (SYNTHROID) 175 MCG tablet Take 175 mcg by mouth daily.  montelukast (SINGULAIR) 10 MG tablet Take 10 mg by mouth at bedtime.       norethindrone-ethinyl estradiol-FE (LOESTRIN FE) 1-20 MG-MCG tablet Take 1 tablet by mouth daily.       pantoprazole (PROTONIX) 40 MG tablet Take 40 mg by mouth in the morning and at bedtime.       Semaglutide (RYBELSUS) 3 MG TABS Take 3 mg by mouth daily.       terbinafine (LAMISIL) 250 MG tablet Take 250 mg by mouth daily.       topiramate (TOPAMAX) 100 MG tablet Take 100 mg by mouth 2 (two) times daily.       phentermine (ADIPEX-P) 37.5 MG tablet Take 37.5 mg by mouth daily before breakfast. (Patient not taking: Reported on 09/10/2022)        No current facility-administered medications for this visit.          Allergies  Allergen Reactions   Bactrim [Sulfamethoxazole-Trimethoprim]     Sulfa Antibiotics Hives      Review of Systems:  Constitutional: Denies fever, chills, diaphoresis, appetite change and fatigue.  HEENT: Denies photophobia, eye pain, redness, hearing loss, ear pain, congestion, sore throat, rhinorrhea, sneezing, mouth sores, neck pain, neck stiffness and tinnitus.   Respiratory:  Denies SOB, DOE, cough, chest tightness,  and wheezing.   Cardiovascular: Denies chest pain, palpitations and leg swelling.  Genitourinary: Denies dysuria, urgency, frequency, hematuria, flank pain and difficulty urinating.  Musculoskeletal: Denies myalgias, back pain, joint swelling, arthralgias and gait problem.  Skin: No rash.  Neurological: Denies dizziness, seizures, syncope, weakness, light-headedness, numbness and headaches.  Hematological: Denies adenopathy. Easy bruising, personal or family bleeding history  Psychiatric/Behavioral: No anxiety or depression       Physical Exam:     Pulse 83   Ht 5\' 7"  (1.702 m)   Wt (!) 320 lb (145.2 kg)   BMI 50.12 kg/m     Wt Readings from Last 3 Encounters:  09/10/22 (!) 320 lb (145.2 kg)  08/02/22 (!) 320 lb (145.2 kg)  03/06/22 (!) 354 lb 3.2 oz (160.7 kg)    Constitutional:  Well-developed, in no acute distress. Psychiatric: Normal mood and affect. Behavior is normal. HEENT: Pupils normal.  Conjunctivae are normal. No scleral icterus. Neck supple.  Cardiovascular: Normal rate, regular rhythm. No edema Pulmonary/chest: Effort normal and breath sounds normal. No wheezing, rales or rhonchi. Abdominal: Soft, nondistended. Nontender. Bowel sounds active throughout. There are no masses palpable. No hepatomegaly. Rectal: Deferred Neurological: Alert and oriented to person place and time. Skin: Skin is warm and dry. No rashes noted.   Data Reviewed: I have personally reviewed following labs and imaging studies   CBC:     Latest Ref Rng & Units 06/04/2019    5:03 PM 05/20/2019   10:54 AM 09/01/2012    6:49 PM  CBC  WBC 4.0 - 10.5 K/uL 10.8  10.2  11.3   Hemoglobin 12.0 - 15.0 g/dL 14.7  14.4  15.2   Hematocrit 36.0 - 46.0 % 42.0  41.4  44.2   Platelets 150 - 400 K/uL 233  265  276       CMP:     Latest Ref Rng & Units 06/04/2019    5:03 PM 05/20/2019   10:54 AM 09/01/2012    6:49 PM  CMP  Glucose 70 - 99 mg/dL 91  92  81   BUN  6 - 20 mg/dL 8  11  13    Creatinine 0.44 - 1.00 mg/dL  0.80  0.85  0.83   Sodium 135 - 145 mmol/L 136  136  141   Potassium 3.5 - 5.1 mmol/L 4.2  3.8  4.7   Chloride 98 - 111 mmol/L 103  103  107   CO2 22 - 32 mmol/L 23  23  27    Calcium 8.9 - 10.3 mg/dL 9.1  9.1  9.0   Total Protein 6.5 - 8.1 g/dL 7.2  7.0  8.2   Total Bilirubin 0.3 - 1.2 mg/dL 1.0  0.7  0.2   Alkaline Phos 38 - 126 U/L 54  53  87   AST 15 - 41 U/L 48  35  41   ALT 0 - 44 U/L 56  38  [place], MD [facility] [phone]

## 2023-02-04 NOTE — Op Note (Signed)
[facility] Patient Name: Brenda Sawyer Procedure Date: 02/04/2023 MRN: [id number] Attending MD: Jackquline Denmark , MD, [id number] Date of Birth: November 09, 1991 CSN: [id number] Age: 32 Admit Type: Outpatient Procedure:                Colonoscopy Indications:              Rectal bleeding. H/O diarrhea Providers:                Jackquline Denmark, MD, Mikey College, RN, Brien Mates, Technician Referring MD:             Gordy Clement MD Medicines:                Monitored Anesthesia Care Complications:            No immediate complications. Estimated Blood Loss:     Estimated blood loss: none. Procedure:                Pre-Anesthesia Assessment:                           - Prior to the procedure, a History and Physical                            was performed, and patient medications and                            allergies were reviewed. The patient's tolerance of                            previous anesthesia was also reviewed. The risks                            and benefits of the procedure and the sedation                            options and risks were discussed with the patient.                            All questions were answered, and informed consent                            was obtained. Prior Anticoagulants: The patient has                            taken no anticoagulant or antiplatelet agents. ASA                            Grade Assessment: II - A patient with mild systemic                            disease. After reviewing the risks and benefits,  the patient was deemed in satisfactory condition to                            undergo the procedure.                           After obtaining informed consent, the colonoscope                            was passed under direct vision. Throughout the                            procedure, the patient's blood pressure, pulse, and                             oxygen saturations were monitored continuously. The                            [id number] [id number]) Olympus colonoscope was                            introduced through the anus and advanced to the 2                            cm into the ileum. The colonoscopy was performed                            without difficulty. The patient tolerated the                            procedure well. The quality of the bowel                            preparation was adequate to identify polyps. There                            was some retained solid vegetable material                            specially in the right side of the colon and in few                            areas of the transverse colon with making it harder                            to visualize despite aggressive suctioning and                            aspiration. The suction channel of the scope got                            clogged several times. Overall over 90% of the  colonic mucosa visualized satisfactorily. The                            terminal ileum, ileocecal valve, appendiceal                            orifice, and rectum were photographed. Scope In: 9:40:13 AM Scope Out: 9:57:49 AM Scope Withdrawal Time: 0 hours 14 minutes 18 seconds  Total Procedure Duration: 0 hours 17 minutes 36 seconds  Findings:      A 4 mm polyp was found in the descending colon. The polyp was sessile.       The polyp was removed with a cold snare. Resection and retrieval were       complete.      A few rare small-mouthed diverticula were found in the sigmoid colon.      Non-bleeding internal hemorrhoids were found during retroflexion. The       hemorrhoids were small and Grade I (internal hemorrhoids that do not       prolapse).      The colon (entire examined portion) appeared normal. Biopsies for       histology were taken with a cold forceps from the entire colon for       evaluation of microscopic colitis.       The terminal ileum appeared normal.      The exam was otherwise without abnormality on direct and retroflexion       views. Impression:               - One 4 mm polyp in the descending colon, removed                            with a cold snare. Resected and retrieved.                           - Minimal sigmoid diverticulosis                           - Non-bleeding internal hemorrhoids.                           - Otherwise normal colonoscopy to TI. Moderate Sedation:      Not Applicable - Patient had care per Anesthesia. Recommendation:           - Patient has a contact number available for                            emergencies. The signs and symptoms of potential                            delayed complications were discussed with the                            patient. Return to normal activities tomorrow.                            Written discharge instructions were provided to the  patient.                           - Resume previous diet.                           - Continue present medications.                           - Await pathology results.                           - Repeat colonoscopy date to be determined after                            pending pathology results are reviewed for                            screening purposes. Likely an earlier interval d/t                            quality of prep. Note that she may need a 2-day                            prep in subsequent colonoscopies                           - The findings and recommendations were discussed                            with the patient's family. Procedure Code(s):        --- Professional ---                           [phone], Colonoscopy, flexible; with removal of                            tumor(s), polyp(s), or other lesion(s) by snare                            technique                           45380, 25, Colonoscopy, flexible; with biopsy,                             single or multiple Diagnosis Code(s):        --- Professional ---                           D12.4, Benign neoplasm of descending colon                           K64.0, First degree hemorrhoids                           K62.5, Hemorrhage of anus and rectum  K57.30, Diverticulosis of large intestine without                            perforation or abscess without bleeding CPT copyright 2022 American Medical Association. All rights reserved. The codes documented in this report are preliminary and upon coder review may  be revised to meet current compliance requirements. Jackquline Denmark, MD 02/04/2023 10:08:20 AM This report has been signed electronically. Number of Addenda: 0

## 2023-02-04 NOTE — Anesthesia Preprocedure Evaluation (Addendum)
Anesthesia Evaluation  Patient identified by MRN, date of birth, ID band Patient awake    Reviewed: Allergy & Precautions, NPO status , Patient's Chart, lab work & pertinent test results  Airway Mallampati: III  TM Distance: >3 FB Neck ROM: Full    Dental  (+) Teeth Intact, Dental Advisory Given   Pulmonary asthma , sleep apnea , Recent URI , former smoker   Pulmonary exam normal breath sounds clear to auscultation       Cardiovascular negative cardio ROS Normal cardiovascular exam Rhythm:Regular Rate:Normal     Neuro/Psych  PSYCHIATRIC DISORDERS Anxiety Depression Bipolar Disorder   negative neurological ROS     GI/Hepatic Neg liver ROS,GERD  ,,Gerd IBS-D rectal bleeding   Endo/Other  Hypothyroidism  Morbid obesity (BMI 51)  Renal/GU negative Renal ROS     Musculoskeletal  (+) Arthritis ,    Abdominal   Peds  Hematology  (+) HIV  Anesthesia Other Findings Day of surgery medications reviewed with the patient.  Reproductive/Obstetrics                             Anesthesia Physical Anesthesia Plan  ASA: 4  Anesthesia Plan: MAC   Post-op Pain Management:    Induction: Intravenous  PONV Risk Score and Plan: 2 and Treatment may vary due to age or medical condition and TIVA  Airway Management Planned: Natural Airway and Simple Face Mask  Additional Equipment:   Intra-op Plan:   Post-operative Plan:   Informed Consent: I have reviewed the patients History and Physical, chart, labs and discussed the procedure including the risks, benefits and alternatives for the proposed anesthesia with the patient or authorized representative who has indicated his/her understanding and acceptance.     Dental advisory given  Plan Discussed with: CRNA and Anesthesiologist  Anesthesia Plan Comments:         Anesthesia Quick Evaluation

## 2023-02-05 ENCOUNTER — Encounter (HOSPITAL_COMMUNITY): Payer: Self-pay | Admitting: Gastroenterology

## 2023-02-05 NOTE — Anesthesia Postprocedure Evaluation (Signed)
Anesthesia Post Note  Patient: Brenda Sawyer  Procedure(s) Performed: COLONOSCOPY WITH PROPOFOL ESOPHAGOGASTRODUODENOSCOPY (EGD) WITH PROPOFOL BIOPSY POLYPECTOMY     Patient location during evaluation: Endoscopy Anesthesia Type: MAC Level of consciousness: oriented, awake and alert and awake Pain management: pain level controlled Vital Signs Assessment: post-procedure vital signs reviewed and stable Respiratory status: spontaneous breathing, nonlabored ventilation, respiratory function stable and patient connected to nasal cannula oxygen Cardiovascular status: blood pressure returned to baseline and stable Postop Assessment: no headache, no backache and no apparent nausea or vomiting Anesthetic complications: no   No notable events documented.  Last Vitals:  Vitals:   02/04/23 1020 02/04/23 1028  BP: 127/77 137/84  Pulse: 78 71  Resp: (!) 21 17  Temp:    SpO2: 100% 100%    Last Pain:  Vitals:   02/04/23 1028  TempSrc:   PainSc: 0-No pain   Pain Goal:                   Santa Lighter

## 2023-02-06 LAB — SURGICAL PATHOLOGY

## 2023-02-09 ENCOUNTER — Encounter: Payer: Self-pay | Admitting: Gastroenterology

## 2023-04-30 ENCOUNTER — Ambulatory Visit: Payer: Medicaid Other | Admitting: Allergy

## 2023-04-30 ENCOUNTER — Other Ambulatory Visit (HOSPITAL_COMMUNITY): Payer: Self-pay

## 2023-04-30 ENCOUNTER — Encounter: Payer: Self-pay | Admitting: Allergy

## 2023-04-30 ENCOUNTER — Telehealth: Payer: Self-pay

## 2023-04-30 VITALS — BP 104/70 | HR 88 | Temp 98.1°F | Resp 16

## 2023-04-30 DIAGNOSIS — J4540 Moderate persistent asthma, uncomplicated: Secondary | ICD-10-CM

## 2023-04-30 DIAGNOSIS — J3089 Other allergic rhinitis: Secondary | ICD-10-CM | POA: Diagnosis not present

## 2023-04-30 DIAGNOSIS — T781XXA Other adverse food reactions, not elsewhere classified, initial encounter: Secondary | ICD-10-CM | POA: Diagnosis not present

## 2023-04-30 DIAGNOSIS — H1013 Acute atopic conjunctivitis, bilateral: Secondary | ICD-10-CM

## 2023-04-30 DIAGNOSIS — J302 Other seasonal allergic rhinitis: Secondary | ICD-10-CM | POA: Diagnosis not present

## 2023-04-30 DIAGNOSIS — T781XXD Other adverse food reactions, not elsewhere classified, subsequent encounter: Secondary | ICD-10-CM

## 2023-04-30 MED ORDER — CETIRIZINE HCL 10 MG PO TABS: ORAL_TABLET | ORAL | 5 refills | Status: DC

## 2023-04-30 MED ORDER — FLUTICASONE PROPIONATE HFA 110 MCG/ACT IN AERO: INHALATION_SPRAY | RESPIRATORY_TRACT | 5 refills | Status: DC

## 2023-04-30 MED ORDER — BREZTRI AEROSPHERE 160-9-4.8 MCG/ACT IN AERO: 2.0000 | INHALATION_SPRAY | Freq: Every day | RESPIRATORY_TRACT | 5 refills | Status: DC

## 2023-04-30 MED ORDER — EPINEPHRINE 0.3 MG/0.3ML IJ SOAJ: 0.3000 mg | INTRAMUSCULAR | 2 refills | Status: DC | PRN

## 2023-04-30 MED ORDER — VENTOLIN HFA 108 (90 BASE) MCG/ACT IN AERS: 2.0000 | INHALATION_SPRAY | RESPIRATORY_TRACT | 1 refills | Status: DC | PRN

## 2023-04-30 MED ORDER — MONTELUKAST SODIUM 10 MG PO TABS: 10.0000 mg | ORAL_TABLET | Freq: Every day | ORAL | 5 refills | Status: DC

## 2023-04-30 MED ORDER — CROMOLYN SODIUM 4 % OP SOLN: OPHTHALMIC | 5 refills | Status: DC

## 2023-04-30 MED ORDER — DYMISTA 137-50 MCG/ACT NA SUSP: NASAL | 5 refills | Status: DC

## 2023-04-30 NOTE — Telephone Encounter (Signed)
Patient Advocate Encounter   Received notification from [facility] [place] that prior authorization is required for Albuterol Sulfate HFA 108 (90 Base)MCG/ACT aerosol   Submitted: n/a Key [id number]  PA not submitted at this time. BRAND name Ventolin is covered.

## 2023-04-30 NOTE — Patient Instructions (Addendum)
Asthma - Daily controller medication(s): Breztri 2 puffs twice a day with spacer device.  Singulair 10mg  daily at bedtime - Prior to physical activity: albuterol 2 puffs 10-15 minutes before physical activity. - Rescue medications: albuterol 2 puffs every 4-6 hours as needed and albuterol nebulizer one vial every 4-6 hours as needed - Changes during respiratory infections or worsening symptoms: Add on generic Flovent (Fluticasone)  2 puffs 2 times daily for TWO WEEKS. - Asthma control goals:  * Full participation in all desired activities (may need albuterol before activity) * Albuterol use two time or less a week on average (not counting use with activity) * Cough interfering with sleep two time or less a month * Oral steroids no more than once a year * No hospitalizations  Allergies (environmental and food) - environmental allergy panel was negative - skin testing to environmental allergens today is grasses, weeds, trees, mold, dust mites.  Allergen avoidance measures provided - skin testing for food allergens is positive to casein (milk protein).     Recommend avoidance of milk/dairy in diet.  - have access to self-injectable epinephrine (Epipen) 0.3mg  at all times - follow emergency action plan in case of allergic reaction - continue Zyrtec 10mg  daily.  This is a antihistamine.  - continue Cromolyn eye drop 1-2 drop each eye up to 4 times a day as needed for itchy/watery eyes - continue Dymista 1 spray each nostril twice a day for runny or stuffy onse as needed  Follow-up in 6 months or sooner if needed

## 2023-04-30 NOTE — Progress Notes (Unsigned)
Follow-up Note  RE: Brenda Sawyer MRN: [id number] DOB: 1991-07-03 Date of Office Visit: 04/30/2023   History of present illness: Brenda Sawyer is a 32 y.o. female presenting today for follow-up of asthma and rhinoconjunctivitis.  She was last seen in the office on 01/22/2023 by myself.  I did obtain environmental allergy panel via blood work as she was sick at this visit and not able to do skin testing.  Lab panel was negative.  I recommended she return for skin testing.  She did stop all her allergy medications for visit today in order to test.  She still notices symptoms of itchy and watery eyes, nasal congestion and itchiness.  I did have her start on Zyrtec as well as Dymista nasal spray and cromolyn eyedrops.  She states her asthma is doing much better as she is on Breztri 2 puffs twice a day and Singulair daily.  She does not report significant use of albuterol nor has she required any ED or urgent care visits since her last visit. She states she gets immediate diarrhea and stomach pains with milk even Lactaid milk.   Review of systems: Review of Systems  Constitutional: Negative.   HENT:         See HPI  Eyes: Negative.   Respiratory: Negative.    Cardiovascular: Negative.   Gastrointestinal: Negative.   Musculoskeletal: Negative.   Skin: Negative.   Allergic/Immunologic: Negative.   Neurological: Negative.      All other systems negative unless noted above in HPI  Past medical/social/surgical/family history have been reviewed and are unchanged unless specifically indicated below.  No changes  Medication List: Current Outpatient Medications  Medication Sig Dispense Refill   albuterol (PROVENTIL) (2.5 MG/3ML) 0.083% nebulizer solution Take 2.5 mg by nebulization every 6 (six) hours as needed for wheezing or shortness of breath.     albuterol (VENTOLIN HFA) 108 (90 Base) MCG/ACT inhaler Inhale 2 puffs into the lungs every 6 (six) hours as needed for shortness of breath.      Budeson-Glycopyrrol-Formoterol (BREZTRI AEROSPHERE) 160-9-4.8 MCG/ACT AERO Inhale 2 puffs into the lungs daily.     cetirizine (ZYRTEC) 10 MG tablet Take one tablet once daily 30 tablet 5   cromolyn (OPTICROM) 4 % ophthalmic solution Use 1-2 drops in each eye up to 4 times daily as needed for itchy/watery eyes 10 mL 5   DYMISTA 137-50 MCG/ACT SUSP Use one spray in each nostril twice daily for runny or stuffy nose. 23 g 5   fluticasone (FLOVENT HFA) 110 MCG/ACT inhaler Inhale two puffs twice daily to prevent cough or wheeze. Rinse mouth after use. 12 g 5   levothyroxine (SYNTHROID) 200 MCG tablet Take 200 mcg by mouth daily before breakfast.     montelukast (SINGULAIR) 10 MG tablet Take 10 mg by mouth at bedtime.     norethindrone-ethinyl estradiol-FE (LOESTRIN FE) 1-20 MG-MCG tablet Take 1 tablet by mouth daily.     phentermine (ADIPEX-P) 37.5 MG tablet Take 37.5 mg by mouth daily before breakfast.     Spacer/Aero-Holding Chambers (AEROCHAMBER MV) inhaler Use as instructed 1 each 2   topiramate (TOPAMAX) 100 MG tablet Take 100 mg by mouth 2 (two) times daily.     No current facility-administered medications for this visit.     Known medication allergies: Allergies  Allergen Reactions   Other Itching    Tide laundry detergent   Cinnamon Other (See Comments)    Artificial cinnamon: Make mouth raw   Sulfa Antibiotics Hives  All Sulfa drugs   Wound Dressing Adhesive Rash   Bactrim [Sulfamethoxazole-Trimethoprim] Hives     Physical examination: Blood pressure 104/70, pulse 88, temperature 98.1 F (36.7 C), temperature source Temporal, resp. rate 16, SpO2 96 %, unknown if currently breastfeeding.  General: Alert, interactive, in no acute distress. HEENT: PERRLA, TMs pearly gray, turbinates minimally edematous without discharge, post-pharynx non erythematous. Neck: Supple without lymphadenopathy. Lungs: Clear to auscultation without wheezing, rhonchi or rales. {no increased work of  breathing. CV: Normal S1, S2 without murmurs. Abdomen: Nondistended, nontender. Skin: Warm and dry, without lesions or rashes. Extremities:  No clubbing, cyanosis or edema. Neuro:   Grossly intact.  Diagnositics/Labs: Labs:  Component     Latest Ref Rng 01/22/2023 50  IgE (Immunoglobulin E), Serum     6 - 495 IU/mL 10   D Pteronyssinus IgE     Class 0 kU/L <0.10   D Farinae IgE     Class 0 kU/L <0.10   Cat Dander IgE     Class 0 kU/L <0.10   Dog Dander IgE     Class 0 kU/L <0.10   [place] Grass IgE     Class 0 kU/L <0.10   Timothy Grass IgE     Class 0 kU/L <0.10   Johnson Grass IgE     Class 0 kU/L <0.10   Cockroach, German IgE     Class 0 kU/L <0.10   Penicillium Chrysogen IgE     Class 0 kU/L <0.10   Cladosporium Herbarum IgE     Class 0 kU/L <0.10   Aspergillus Fumigatus IgE     Class 0 kU/L <0.10   Alternaria Alternata IgE     Class 0 kU/L <0.10   Maple/Box Elder IgE     Class 0 kU/L <0.10   Common Silver Charletta Cousin IgE     Class 0 kU/L <0.10   Cedar, [place] IgE     Class 0 kU/L <0.10   Oak, White IgE     Class 0 kU/L <0.10   Elm, American IgE     Class 0 kU/L <0.10   Cottonwood IgE     Class 0 kU/L <0.10   Pecan, Hickory IgE     Class 0 kU/L <0.10   White Mulberry IgE     Class 0 kU/L <0.10   Ragweed, Short IgE     Class 0 kU/L <0.10   Pigweed, Rough IgE     Class 0 kU/L <0.10   Sheep Sorrel IgE Qn     Class 0 kU/L <0.10   Mouse Urine IgE     Class 0 kU/L <0.10   WBC     3.4 - 10.8 x10E3/uL 9.4   RBC     3.77 - 5.28 x10E6/uL 4.58   Hemoglobin     11.1 - 15.9 g/dL 16.1   HCT     09.6 - 04.5 % 41.6   MCV     79 - 97 fL 91   MCH     26.6 - 33.0 pg 31.4   MCHC     31.5 - 35.7 g/dL 40.9   RDW     81.1 - 91.4 % 12.5   Platelets     150 - 450 x10E3/uL 246   Neutrophils     Not Estab. % 69   Lymphs     Not Estab. % 24   Monocytes     Not Estab. % 4   Eos  Not Estab. % 2   Basos     Not Estab. % 0   NEUT#     1.4 - 7.0 x10E3/uL 6.5    Lymphocyte #     0.7 - 3.1 x10E3/uL 2.2   Monocytes Absolute     0.1 - 0.9 x10E3/uL 0.4   EOS (ABSOLUTE)     0.0 - 0.4 x10E3/uL 0.1   Basophils Absolute     0.0 - 0.2 x10E3/uL 0.0   Immature Granulocytes     Not Estab. % 1   Immature Grans (Abs)     0.0 - 0.1 x10E3/uL 0.1      Airborne Adult Perc - 04/30/23 1213     Time Antigen Placed 1213    Allergen Manufacturer Greer    Location Back    Number of Test 55    1. Control-Buffer 50% Glycerol Negative    2. Control-Histamine 2+    3. Bahia 2+    4. [place] Negative    5. Johnson Negative    6. Kentucky Blue Negative    7. Meadow Fescue 2+    8. Perennial Rye Negative    9. Timothy Negative    10. Ragweed Mix Negative    11. Cocklebur 2+    12. Plantain,  English Negative    13. Baccharis Negative    14. Dog Fennel Negative    15. Russian Thistle Negative    16. Lamb's Quarters 2+    17. Sheep Sorrell Negative    18. Rough Pigweed Negative    19. Marsh Elder, Rough Negative    20. Mugwort, Common Negative    21. Box, Elder Negative    22. Cedar, red 2+    23. Sweet Gum Negative    24. Pecan Pollen 2+    25. Pine Mix 2+    26. Walnut, Black Pollen 2+    27. Red Mulberry Negative    28. Ash Mix Negative    29. Birch Mix Negative    30. Beech American Negative    31. Cottonwood, [place] 2+    32. Hickory, White Negative    33. Maple Mix Negative    34. Oak, [place] Mix Negative    35. Sycamore Eastern Negative    36. Alternaria Alternata 2+    37. Cladosporium Herbarum Negative    38. Aspergillus Mix Negative    39. Penicillium Mix Negative    40. Bipolaris Sorokiniana (Helminthosporium) Negative    41. Drechslera Spicifera (Curvularia) Negative    42. Mucor Plumbeus Negative    43. Fusarium Moniliforme Negative    44. Aureobasidium Pullulans (pullulara) Negative    45. Rhizopus Oryzae Negative    46. Botrytis Cinera 2+    47. Epicoccum Nigrum Negative    48. Phoma Betae Negative    49. Dust Mite Mix  2+    50. Cat Hair 10,000 BAU/ml Negative    51.  Dog Epithelia Negative    52. Mixed Feathers Negative    53. Horse Epithelia Negative    54. Cockroach, German Negative    55. Tobacco Leaf Negative             13 Food Perc - 04/30/23 1213       Test Information   Time Antigen Placed 1213    Allergen Manufacturer Waynette Buttery    Location Back    Number of allergen test 15      Food   1. Peanut Negative    2.  Soybean Negative    3. Wheat Negative    4. Sesame Negative    5. Milk, Cow Negative    6. Casein --   2 X 6   7. Egg White, Chicken Negative    8. Shellfish Mix Negative    9. Fish Mix Negative    10. Cashew Negative    11. Walnut Food Negative    12. Almond Negative    13. Hazelnut Negative              Assessment and plan:   Asthma - Daily controller medication(s): Breztri 2 puffs twice a day with spacer device.  Singulair 10mg  daily at bedtime - Prior to physical activity: albuterol 2 puffs 10-15 minutes before physical activity. - Rescue medications: albuterol 2 puffs every 4-6 hours as needed and albuterol nebulizer one vial every 4-6 hours as needed - Changes during respiratory infections or worsening symptoms: Add on generic Flovent (Fluticasone)  2 puffs 2 times daily for TWO WEEKS. - Asthma control goals:  * Full participation in all desired activities (may need albuterol before activity) * Albuterol use two time or less a week on average (not counting use with activity) * Cough interfering with sleep two time or less a month * Oral steroids no more than once a year * No hospitalizations  Allergies (environmental and food) - environmental allergy panel was negative - skin testing to environmental allergens today is grasses, weeds, trees, mold, dust mites.  Allergen avoidance measures provided - skin testing for food allergens is positive to casein (milk protein).     Recommend avoidance of milk/dairy in diet.  - have access to  self-injectable epinephrine (Epipen) 0.3mg  at all times - follow emergency action plan in case of allergic reaction - continue Zyrtec 10mg  daily.  This is a antihistamine.  - continue Cromolyn eye drop 1-2 drop each eye up to 4 times a day as needed for itchy/watery eyes - continue Dymista 1 spray each nostril twice a day for runny or stuffy onse as needed  Follow-up in 6 months or sooner if needed  I appreciate the opportunity to take part in [place] care. Please do not hesitate to contact me with questions.  Sincerely,   Margo Aye, MD Allergy/Immunology [facility]

## 2023-05-01 MED ORDER — VENTOLIN HFA 108 (90 BASE) MCG/ACT IN AERS: 2.0000 | INHALATION_SPRAY | RESPIRATORY_TRACT | 1 refills | Status: DC | PRN

## 2023-05-01 NOTE — Telephone Encounter (Signed)
Brand name  Ventolin sent to pharmacy.

## 2023-07-15 ENCOUNTER — Emergency Department (HOSPITAL_BASED_OUTPATIENT_CLINIC_OR_DEPARTMENT_OTHER)
Admission: EM | Admit: 2023-07-15 | Discharge: 2023-07-15 | Disposition: A | Discharge status: Home / Self Care | Payer: Medicaid Other | Attending: Emergency Medicine | Admitting: Emergency Medicine

## 2023-07-15 ENCOUNTER — Encounter (HOSPITAL_BASED_OUTPATIENT_CLINIC_OR_DEPARTMENT_OTHER): Payer: Self-pay | Admitting: *Deleted

## 2023-07-15 ENCOUNTER — Emergency Department (HOSPITAL_BASED_OUTPATIENT_CLINIC_OR_DEPARTMENT_OTHER): Payer: Medicaid Other

## 2023-07-15 ENCOUNTER — Other Ambulatory Visit: Payer: Self-pay

## 2023-07-15 DIAGNOSIS — Z79890 Hormone replacement therapy (postmenopausal): Secondary | ICD-10-CM | POA: Diagnosis not present

## 2023-07-15 DIAGNOSIS — J45901 Unspecified asthma with (acute) exacerbation: Secondary | ICD-10-CM | POA: Insufficient documentation

## 2023-07-15 DIAGNOSIS — R739 Hyperglycemia, unspecified: Secondary | ICD-10-CM | POA: Insufficient documentation

## 2023-07-15 DIAGNOSIS — D72829 Elevated white blood cell count, unspecified: Secondary | ICD-10-CM | POA: Insufficient documentation

## 2023-07-15 DIAGNOSIS — Z7951 Long term (current) use of inhaled steroids: Secondary | ICD-10-CM | POA: Insufficient documentation

## 2023-07-15 DIAGNOSIS — E039 Hypothyroidism, unspecified: Secondary | ICD-10-CM | POA: Diagnosis not present

## 2023-07-15 DIAGNOSIS — J189 Pneumonia, unspecified organism: Secondary | ICD-10-CM | POA: Insufficient documentation

## 2023-07-15 DIAGNOSIS — R0602 Shortness of breath: Secondary | ICD-10-CM | POA: Diagnosis present

## 2023-07-15 LAB — CBC WITH DIFFERENTIAL/PLATELET
Abs Immature Granulocytes: 0.12 10*3/uL — ABNORMAL HIGH (ref 0.00–0.07)
Basophils Absolute: 0.1 10*3/uL (ref 0.0–0.1)
Basophils Relative: 0 %
Eosinophils Absolute: 0 10*3/uL (ref 0.0–0.5)
Eosinophils Relative: 0 %
HCT: 43.7 % (ref 36.0–46.0)
Hemoglobin: 14.9 g/dL (ref 12.0–15.0)
Immature Granulocytes: 1 %
Lymphocytes Relative: 14 %
Lymphs Abs: 1.9 10*3/uL (ref 0.7–4.0)
MCH: 30.3 pg (ref 26.0–34.0)
MCHC: 34.1 g/dL (ref 30.0–36.0)
MCV: 89 fL (ref 80.0–100.0)
Monocytes Absolute: 0.2 10*3/uL (ref 0.1–1.0)
Monocytes Relative: 2 %
Neutro Abs: 10.9 10*3/uL — ABNORMAL HIGH (ref 1.7–7.7)
Neutrophils Relative %: 83 %
Platelets: 265 10*3/uL (ref 150–400)
RBC: 4.91 MIL/uL (ref 3.87–5.11)
RDW: 12.4 % (ref 11.5–15.5)
WBC: 13.1 10*3/uL — ABNORMAL HIGH (ref 4.0–10.5)
nRBC: 0 % (ref 0.0–0.2)

## 2023-07-15 LAB — BASIC METABOLIC PANEL
Anion gap: 12 (ref 5–15)
BUN: 18 mg/dL (ref 6–20)
CO2: 23 mmol/L (ref 22–32)
Calcium: 9.2 mg/dL (ref 8.9–10.3)
Chloride: 99 mmol/L (ref 98–111)
Creatinine, Ser: 0.73 mg/dL (ref 0.44–1.00)
GFR, Estimated: >60 mL/min (ref >60)
Glucose, Bld: 264 mg/dL — ABNORMAL HIGH (ref 70–99)
Potassium: 3.8 mmol/L (ref 3.5–5.1)
Sodium: 134 mmol/L — ABNORMAL LOW (ref 135–145)

## 2023-07-15 MED ORDER — IBUPROFEN 800 MG PO TABS
800.0000 mg | ORAL_TABLET | Freq: Once | ORAL | Status: AC
  Administered 2023-07-15: 800 mg via ORAL
  Filled 2023-07-15: qty 1

## 2023-07-15 MED ORDER — ALBUTEROL SULFATE (2.5 MG/3ML) 0.083% IN NEBU
10.0000 mg | INHALATION_SOLUTION | Freq: Once | RESPIRATORY_TRACT | Status: AC
  Administered 2023-07-15: 10 mg via RESPIRATORY_TRACT
  Filled 2023-07-15: qty 12

## 2023-07-15 MED ORDER — ALBUTEROL SULFATE HFA 108 (90 BASE) MCG/ACT IN AERS
8.0000 | INHALATION_SPRAY | Freq: Once | RESPIRATORY_TRACT | Status: AC
  Administered 2023-07-15: 8 via RESPIRATORY_TRACT
  Filled 2023-07-15: qty 6.7

## 2023-07-15 MED ORDER — AMOXICILLIN-POT CLAVULANATE 875-125 MG PO TABS: 1.0000 | ORAL_TABLET | Freq: Two times a day (BID) | ORAL | 0 refills | Status: AC

## 2023-07-15 MED ORDER — DOXYCYCLINE HYCLATE 100 MG PO CAPS: 100.0000 mg | ORAL_CAPSULE | Freq: Two times a day (BID) | ORAL | 0 refills | Status: AC

## 2023-07-15 MED ORDER — MAGNESIUM SULFATE 2 GM/50ML IV SOLN
2.0000 g | Freq: Once | INTRAVENOUS | Status: AC
  Administered 2023-07-15: 2 g via INTRAVENOUS
  Filled 2023-07-15: qty 50

## 2023-07-15 NOTE — Discharge Instructions (Addendum)
Thank you for allowing [place] to be a part of your care today.  You were evaluated in the ED for shortness of breath.  Your chest x-ray shows pneumonia on the right side.   I have sent over 2 antibiotics to the pharmacy to treat your pneumonia.  Please take these as prescribed.  Continue to use your nebulizer treatments as needed for chest tightness and/or wheezing.   Schedule a follow up appointment with your primary care provider.    Return to the ED if you develop sudden worsening of your symptoms or if you have any new concerns.

## 2023-07-15 NOTE — ED Notes (Signed)
Ambulated patient per order. Resting oxygen saturation 97%/ RR 25/HR 123. Patient very shaky, able to talk in complete sentences without difficulty. Patient returned to bed placed back on monitor. RR 27/ HR 130/ oxygen saturation 98% on RA.  Patient tolerated well.

## 2023-07-15 NOTE — ED Provider Notes (Signed)
[place] [facility]ERGENCY DEPARTMENT AT [facility] Provider Note   CSN: [id number] Arrival date & time: 07/15/23  1612     History  Chief Complaint  Patient presents with   Shortness of Breath    Brenda Sawyer is a 32 y.o. female with past medical history significant for PTSD, hypothyroidism, asthma, bipolar depression, OSA, anxiety presents to the ED from urgent care complaining of shortness of breath and cough. Patient states she started having URI symptoms with cough on Tuesday and it has progressively worsened.  Patient has been using nebulizer at home.  She has had steroids and Z-pak as well without relief.  UC sent her to ED to assess for pneumonia.  Denies fever, chills, chest pain, syncope, palpitations.  She reports that she was tested at urgent care for flu and COVID, both were negative.  She also received IM steroid shot at [facility] today.         Home Medications Prior to Admission medications   Medication Sig Start Date End Date Taking? Authorizing Provider  amoxicillin-clavulanate (AUGMENTIN) 875-125 MG tablet Take 1 tablet by mouth 2 (two) times daily for 5 days. 07/15/23 07/20/23 Yes Deniese Oberry R, PA-C  doxycycline (VIBRAMYCIN) 100 MG capsule Take 1 capsule (100 mg total) by mouth 2 (two) times daily for 5 days. 07/15/23 07/20/23 Yes Caressa Scearce R, PA-C  albuterol (PROVENTIL) (2.5 MG/3ML) 0.083% nebulizer solution Take 2.5 mg by nebulization every 6 (six) hours as needed for wheezing or shortness of breath.    [provider]  Budeson-Glycopyrrol-Formoterol (BREZTRI AEROSPHERE) 160-9-4.8 MCG/ACT AERO Inhale 2 puffs into the lungs daily. 04/30/23   Marcelyn Bruins, MD  cetirizine (ZYRTEC) 10 MG tablet Take one tablet once daily 04/30/23   Marcelyn Bruins, MD  cromolyn (OPTICROM) 4 % ophthalmic solution Use 1-2 drops in each eye up to 4 times daily as needed for itchy/watery eyes 04/30/23   Marcelyn Bruins, MD  DYMISTA 137-50 MCG/ACT  SUSP Use one spray in each nostril twice daily for runny or stuffy nose. 04/30/23   Marcelyn Bruins, MD  EPINEPHrine (EPIPEN 2-PAK) 0.3 mg/0.3 mL IJ SOAJ injection Inject 0.3 mg into the muscle as needed for anaphylaxis. 04/30/23   Marcelyn Bruins, MD  fluticasone (FLOVENT HFA) 110 MCG/ACT inhaler Inhale two puffs twice daily to prevent cough or wheeze. Rinse mouth after use. 04/30/23   Padgett, Pilar Grammes, MD  levothyroxine (SYNTHROID) 200 MCG tablet Take 200 mcg by mouth daily before breakfast. 07/16/22   [provider]  montelukast (SINGULAIR) 10 MG tablet Take 1 tablet (10 mg total) by mouth at bedtime. 04/30/23   Marcelyn Bruins, MD  norethindrone-ethinyl estradiol-FE (LOESTRIN FE) 1-20 MG-MCG tablet Take 1 tablet by mouth daily. 12/10/21   [provider]  phentermine (ADIPEX-P) 37.5 MG tablet Take 37.5 mg by mouth daily before breakfast.    [provider]  Spacer/Aero-Holding Chambers (AEROCHAMBER MV) inhaler Use as instructed 01/22/23   Marcelyn Bruins, MD  topiramate (TOPAMAX) 100 MG tablet Take 100 mg by mouth 2 (two) times daily. 06/15/22   [provider]  VENTOLIN HFA 108 (90 Base) MCG/ACT inhaler Inhale 2 puffs into the lungs every 4 (four) hours as needed for wheezing or shortness of breath. 05/01/23   Marcelyn Bruins, MD      Allergies    Other, Cinnamon, Sulfa antibiotics, Wound dressing adhesive, and Bactrim [sulfamethoxazole-trimethoprim]    Review of Systems   Review of Systems  Constitutional:  Negative for chills and fever.  Respiratory:  Positive for cough, chest tightness and shortness of breath.   Cardiovascular:  Negative for chest pain and palpitations.  Neurological:  Negative for syncope.    Physical Exam Updated Vital Signs BP (!) 165/112   Pulse (!) 110   Temp 98.9 F (37.2 C) (Oral)   Resp 20   Ht 5\' 7"  (1.702 m)   Wt (!) 154.2 kg   LMP 07/08/2023 (Approximate)   SpO2  98%   BMI 53.25 kg/m  Physical Exam Vitals and nursing note reviewed.  Constitutional:      General: She is not in acute distress.    Appearance: Normal appearance. She is ill-appearing. She is not diaphoretic.  Cardiovascular:     Rate and Rhythm: Normal rate and regular rhythm.  Pulmonary:     Effort: Pulmonary effort is normal. Tachypnea present. No accessory muscle usage or respiratory distress.     Breath sounds: Normal air entry. No stridor. Examination of the right-lower field reveals decreased breath sounds. Examination of the left-lower field reveals decreased breath sounds. Decreased breath sounds present. No wheezing.     Comments: Patient speaking in full sentences, but appears to have mildly increased work of breathing.   Skin:    General: Skin is warm and dry.     Capillary Refill: Capillary refill takes less than 2 seconds.  Neurological:     Mental Status: She is alert. Mental status is at baseline.  Psychiatric:        Mood and Affect: Mood normal.        Behavior: Behavior normal.     ED Results / Procedures / Treatments   Labs (all labs ordered are listed, but only abnormal results are displayed) Labs Reviewed  CBC WITH DIFFERENTIAL/PLATELET - Abnormal; Notable for the following components:      Result Value   WBC 13.1 (*)    Neutro Abs 10.9 (*)    Abs Immature Granulocytes 0.12 (*)    All other components within normal limits  BASIC METABOLIC PANEL    EKG None  Radiology DG Chest 2 View  Result Date: 07/15/2023 CLINICAL DATA:  Shortness of breath.  History of asthma. EXAM: CHEST - 2 VIEW COMPARISON:  11/05/2022. FINDINGS: Low lung volume. There is an approximately 3 x 4 cm heterogeneous opacity in the right paracardiac region. The opacity is not discretely seen on the lateral film. Bilateral lungs are otherwise grossly clear. There is. The elevated right hemidiaphragm. Bilateral lateral costophrenic angles are clear. Normal cardio-mediastinal  silhouette. No acute osseous abnormalities. The soft tissues are within normal limits. IMPRESSION: *3 x 4 cm heterogeneous opacity in the right paracardiac region. This may represent pneumonia in appropriate clinical settings. Radiographic follow-up to resolution is recommended to exclude underlying mass. Electronically Signed   By: Jules Schick M.D.   On: 07/15/2023 17:07    Procedures Procedures    Medications Ordered in ED Medications  magnesium sulfate IVPB 2 g 50 mL (2 g Intravenous New Bag/Given 07/15/23 1822)  ibuprofen (ADVIL) tablet 800 mg (has no administration in time range)  albuterol (PROVENTIL) (2.5 MG/3ML) 0.083% nebulizer solution 10 mg (10 mg Nebulization Given 07/15/23 1656)    ED Course/ Medical Decision Making/ A&P                                 Medical Decision Making Amount and/or Complexity of Data Reviewed Radiology:  ordered.  Risk Prescription drug management.   This patient presents to the ED with chief complaint(s) of shortness of breath, cough with pertinent past medical history of asthma, OSA.  The complaint involves an extensive differential diagnosis and also carries with it a high risk of complications and morbidity.    The differential diagnosis includes acute asthma exacerbation, pneumonia   The initial plan is to obtain chest x-ray, ECG  Additional history obtained: Records reviewed  - patient seen at [facility] in [place].  Unable to review notes.  Initial Assessment:   Exam significant for ill-appearing patient who is tachypneic with mildly increased work of breathing.  She is able to speak in full sentences.  Lung sounds diminished in bases.  No appreciable wheezing.  HR is tachycardic around 104 bpm with regular rhythm.  Skin is warm and dry.    Patient ambulated on pulse ox from triage to room with lowest SPO2 of 95%.    Independent ECG/labs interpretation:  The following labs were independently interpreted:  CBC with leukocytosis,  no anemia.  Metabolic panel with elevated glucose, likely related to   Independent visualization and interpretation of imaging: I independently visualized the following imaging with scope of interpretation limited to determining acute life threatening conditions related to emergency care: chest x-ray, which revealed right sided pneumonia.  Treatment and Reassessment: Patient given continuous neb with improvement in symptoms.  She is speaking in full sentences with improved work of breathing.  Will also give magnesium to help with exacerbated asthma.  Patient has already had steroid at [facility].   Patient's lung sounds have improved.  No auscultated wheezing.   Disposition:   Augmentin and doxycycline prescriptions sent to patient's pharmacy for treatment of CAP.  Patient has had improvement in asthma symptoms with ED treatment.  Advised patient to follow up with PCP.    The patient has been appropriately medically screened and/or stabilized in the ED. I have low suspicion for any other emergent medical condition which would require further screening, evaluation or treatment in the ED or require inpatient management. At time of discharge the patient is hemodynamically stable and in no acute distress. I have discussed work-up results and diagnosis with patient and answered all questions. Patient is agreeable with discharge plan. We discussed strict return precautions for returning to the emergency department and they verbalized understanding.            Final Clinical Impression(s) / ED Diagnoses Final diagnoses:  Community acquired pneumonia of right lung, unspecified part of lung  Exacerbation of persistent asthma, unspecified asthma severity    Rx / DC Orders ED Discharge Orders          Ordered    amoxicillin-clavulanate (AUGMENTIN) 875-125 MG tablet  2 times daily        07/15/23 1829    doxycycline (VIBRAMYCIN) 100 MG capsule  2 times daily        07/15/23 1829               Lenard Simmer, PA-C 07/15/23 1840    Benjiman Core, MD 07/15/23 [phone]

## 2023-07-15 NOTE — ED Notes (Signed)
Ambulated from triage SpO2 95-97, HR 115-120, RR 28-32, accessory muscle use.  07/15/23 1628  Respiratory Assessment  $ RT Protocol Assessment  Yes  Assessment Type Pre-treatment  Respiratory Pattern Regular;Labored;Symmetrical;Dyspnea at rest;Accessory muscle use  Chest Assessment Chest expansion symmetrical  Cough Productive  Sputum Color Yellow;Green  Sputum Specimen Source Spontaneous cough  Bilateral Breath Sounds Diminished;Rhonchi  Oxygen Therapy/Pulse Ox  O2 Therapy Room air  SpO2 98 %

## 2023-07-15 NOTE — ED Triage Notes (Signed)
Patient presents to ED via POV from home. Here with shortness of breath. States "urgent care sent me to make sure I dont have pneumonia". Recent dx of URI.

## 2023-08-21 ENCOUNTER — Other Ambulatory Visit: Payer: Self-pay

## 2023-08-21 ENCOUNTER — Other Ambulatory Visit: Payer: Self-pay | Admitting: Specialist

## 2023-08-21 DIAGNOSIS — E041 Nontoxic single thyroid nodule: Secondary | ICD-10-CM

## 2023-10-29 ENCOUNTER — Ambulatory Visit: Payer: Medicaid Other | Admitting: Allergy

## 2023-10-29 ENCOUNTER — Encounter: Payer: Self-pay | Admitting: Allergy

## 2023-10-29 VITALS — BP 122/78 | HR 82 | Temp 98.0°F

## 2023-10-29 DIAGNOSIS — H1013 Acute atopic conjunctivitis, bilateral: Secondary | ICD-10-CM | POA: Diagnosis not present

## 2023-10-29 DIAGNOSIS — J302 Other seasonal allergic rhinitis: Secondary | ICD-10-CM

## 2023-10-29 DIAGNOSIS — J3089 Other allergic rhinitis: Secondary | ICD-10-CM

## 2023-10-29 DIAGNOSIS — J4540 Moderate persistent asthma, uncomplicated: Secondary | ICD-10-CM | POA: Diagnosis not present

## 2023-10-29 DIAGNOSIS — T781XXD Other adverse food reactions, not elsewhere classified, subsequent encounter: Secondary | ICD-10-CM

## 2023-10-29 MED ORDER — FLUTICASONE PROPIONATE HFA 110 MCG/ACT IN AERO: INHALATION_SPRAY | RESPIRATORY_TRACT | 5 refills | Status: AC

## 2023-10-29 MED ORDER — VENTOLIN HFA 108 (90 BASE) MCG/ACT IN AERS: 2.0000 | INHALATION_SPRAY | RESPIRATORY_TRACT | 1 refills | Status: AC | PRN

## 2023-10-29 MED ORDER — CROMOLYN SODIUM 4 % OP SOLN: OPHTHALMIC | 5 refills | Status: DC

## 2023-10-29 MED ORDER — BREZTRI AEROSPHERE 160-9-4.8 MCG/ACT IN AERO: 2.0000 | INHALATION_SPRAY | Freq: Every day | RESPIRATORY_TRACT | 5 refills | Status: AC

## 2023-10-29 MED ORDER — MONTELUKAST SODIUM 10 MG PO TABS: 10.0000 mg | ORAL_TABLET | Freq: Every day | ORAL | 5 refills | Status: DC

## 2023-10-29 MED ORDER — CETIRIZINE HCL 10 MG PO TABS: ORAL_TABLET | ORAL | 5 refills | Status: DC

## 2023-10-29 MED ORDER — DYMISTA 137-50 MCG/ACT NA SUSP: NASAL | 5 refills | Status: DC

## 2023-10-29 MED ORDER — ALBUTEROL SULFATE (2.5 MG/3ML) 0.083% IN NEBU: 2.5000 mg | INHALATION_SOLUTION | RESPIRATORY_TRACT | 1 refills | Status: DC | PRN

## 2023-10-29 NOTE — Patient Instructions (Addendum)
Asthma - Daily controller medication(s): Breztri 2 puffs twice a day with spacer device.  Singulair 10mg  daily at bedtime - Prior to physical activity: albuterol 2 puffs 10-15 minutes before physical activity. - Rescue medications: albuterol 2 puffs every 4-6 hours as needed and albuterol nebulizer one vial every 4-6 hours as needed - Changes during respiratory infections or worsening symptoms: Add on generic Flovent (Fluticasone)  2 puffs 2 times daily for TWO WEEKS. - Asthma control goals:  * Full participation in all desired activities (may need albuterol before activity) * Albuterol use two time or less a week on average (not counting use with activity) * Cough interfering with sleep two time or less a month * Oral steroids no more than once a year * No hospitalizations  Allergies (environmental and food) - Continue avoidance measures grasses, weeds, trees, mold, dust mites.  - skin testing for food allergens was positive to casein (milk protein).  Recommend avoidance of milk/dairy in diet.  - have access to self-injectable epinephrine (Epipen) 0.3mg  at all times - follow emergency action plan in case of allergic reaction - continue Zyrtec 10mg  daily.  This is a antihistamine.  - continue Cromolyn eye drop 1-2 drop each eye up to 4 times a day as needed for itchy/watery eyes - continue Dymista 1 spray each nostril twice a day for runny or stuffy onse as needed  Follow-up in 6 months or sooner if needed

## 2023-10-29 NOTE — Progress Notes (Signed)
Follow-up Note  RE: Brenda Sawyer MRN: [id number] DOB: Mar 06, 1991 Date of Office Visit: 10/29/2023   History of present illness: Brenda Sawyer is a 32 y.o. female presenting today for follow-up of asthma and allergic rhinitis with conjunctivitis.  She was last seen in the office on 04/30/23 by myself.    Discussed the use of AI scribe software for clinical note transcription with the patient, who gave verbal consent to proceed.  She notes that her breathing has improved since the end of pollen season, but still experiences shortness of breath after climbing stairs in her two-story apartment if she has to go back and forth. The use of a rescue inhaler provides relief from these symptoms.  She continues on [place] as a maintenance inhaler.  She has not been taking Singulair as she recently ran out of and has not gotten it refilled.  She also takes cetirizine for allergies. She reports a recent episode of pneumonia a couple of months ago, which required emergency department visit, multiple rounds of antibiotics, two steroid shots, and frequent breathing treatments. However did not require hospitalization.  The pneumonia was localized in the right lung and took a significant time to recover from.   In addition to her respiratory issues, the patient also experienced a stomach virus last week for which she states took about 3 days to resolve.  She also reports increased eye watering, which is partially relieved by cromolyn eye drops used at night. The patient suggests she might benefit from an additional morning dose.     Review of systems: 10pt ROS negative unless noted above in HPI   All other systems negative unless noted above in HPI  Past medical/social/surgical/family history have been reviewed and are unchanged unless specifically indicated below.  No changes  Medication List: Current Outpatient Medications  Medication Sig Dispense Refill   albuterol (PROVENTIL) (2.5 MG/3ML) 0.083%  nebulizer solution Take 2.5 mg by nebulization every 6 (six) hours as needed for wheezing or shortness of breath.     Budeson-Glycopyrrol-Formoterol (BREZTRI AEROSPHERE) 160-9-4.8 MCG/ACT AERO Inhale 2 puffs into the lungs daily. 10.7 g 5   cetirizine (ZYRTEC) 10 MG tablet Take one tablet once daily 30 tablet 5   DYMISTA 137-50 MCG/ACT SUSP Use one spray in each nostril twice daily for runny or stuffy nose. 23 g 5   EPINEPHrine (EPIPEN 2-PAK) 0.3 mg/0.3 mL IJ SOAJ injection Inject 0.3 mg into the muscle as needed for anaphylaxis. 2 each 2   fluticasone (FLOVENT HFA) 110 MCG/ACT inhaler Inhale two puffs twice daily to prevent cough or wheeze. Rinse mouth after use. 12 g 5   levothyroxine (SYNTHROID) 200 MCG tablet Take 200 mcg by mouth daily before breakfast.     Spacer/Aero-Holding Chambers (AEROCHAMBER MV) inhaler Use as instructed 1 each 2   VENTOLIN HFA 108 (90 Base) MCG/ACT inhaler Inhale 2 puffs into the lungs every 4 (four) hours as needed for wheezing or shortness of breath. 18 g 1   cromolyn (OPTICROM) 4 % ophthalmic solution Use 1-2 drops in each eye up to 4 times daily as needed for itchy/watery eyes 10 mL 5   montelukast (SINGULAIR) 10 MG tablet Take 1 tablet (10 mg total) by mouth at bedtime. (Patient not taking: Reported on 10/29/2023) 30 tablet 5   norethindrone-ethinyl estradiol-FE (LOESTRIN FE) 1-20 MG-MCG tablet Take 1 tablet by mouth daily. (Patient not taking: Reported on 10/29/2023)     phentermine (ADIPEX-P) 37.5 MG tablet Take 37.5 mg by mouth daily before  breakfast. (Patient not taking: Reported on 10/29/2023)     topiramate (TOPAMAX) 100 MG tablet Take 100 mg by mouth 2 (two) times daily. (Patient not taking: Reported on 10/29/2023)     No current facility-administered medications for this visit.     Known medication allergies: Allergies  Allergen Reactions   Other Itching    Tide laundry detergent   Cinnamon Other (See Comments)    Artificial cinnamon: Make mouth raw    Sulfa Antibiotics Hives    All Sulfa drugs   Wound Dressing Adhesive Rash   Bactrim [Sulfamethoxazole-Trimethoprim] Hives     Physical examination: Blood pressure 122/78, pulse 82, temperature 98 F (36.7 C), temperature source Temporal, SpO2 98%, unknown if currently breastfeeding.  General: Alert, interactive, in no acute distress. HEENT: PERRLA, TMs pearly gray, turbinates minimally edematous without discharge, post-pharynx non erythematous. Neck: Supple without lymphadenopathy. Lungs: Clear to auscultation without wheezing, rhonchi or rales. {no increased work of breathing. CV: Normal S1, S2 without murmurs. Abdomen: Nondistended, nontender. Skin: Warm and dry, without lesions or rashes. Extremities:  No clubbing, cyanosis or edema. Neuro:   Grossly intact.  Diagnositics/Labs:  Spirometry: FEV1: 2.23L 64%, FVC: 2.6L 62%, ratio consistent with nonobstructive pattern  Assessment and plan: Asthma - Daily controller medication(s): Breztri 2 puffs twice a day with spacer device.  Singulair 10mg  daily at bedtime - Prior to physical activity: albuterol 2 puffs 10-15 minutes before physical activity. - Rescue medications: albuterol 2 puffs every 4-6 hours as needed and albuterol nebulizer one vial every 4-6 hours as needed - Changes during respiratory infections or worsening symptoms: Add on generic Flovent (Fluticasone)  2 puffs 2 times daily for TWO WEEKS. - Asthma control goals:  * Full participation in all desired activities (may need albuterol before activity) * Albuterol use two time or less a week on average (not counting use with activity) * Cough interfering with sleep two time or less a month * Oral steroids no more than once a year * No hospitalizations  Allergic rhinitis with conjunctivitis Adverse food reaction - Continue avoidance measures grasses, weeds, trees, mold, dust mites.  - skin testing for food allergens was positive to casein (milk protein).   Recommend avoidance of milk/dairy in diet.  - have access to self-injectable epinephrine (Epipen) 0.3mg  at all times - follow emergency action plan in case of allergic reaction - continue Zyrtec 10mg  daily.  This is a antihistamine.  - continue Cromolyn eye drop 1-2 drop each eye up to 4 times a day as needed for itchy/watery eyes - continue Dymista 1 spray each nostril twice a day for runny or stuffy onse as needed  Follow-up in 6 months or sooner if needed  I appreciate the opportunity to take part in [place] care. Please do not hesitate to contact me with questions.  Sincerely,   Margo Aye, MD Allergy/Immunology [facility]

## 2024-02-13 DIAGNOSIS — C73 Malignant neoplasm of thyroid gland: Secondary | ICD-10-CM | POA: Insufficient documentation

## 2024-04-14 DIAGNOSIS — Z9889 Other specified postprocedural states: Secondary | ICD-10-CM | POA: Insufficient documentation

## 2024-04-14 DIAGNOSIS — E063 Autoimmune thyroiditis: Secondary | ICD-10-CM | POA: Insufficient documentation

## 2024-04-29 DIAGNOSIS — R5381 Other malaise: Secondary | ICD-10-CM | POA: Insufficient documentation

## 2024-09-29 DIAGNOSIS — E119 Type 2 diabetes mellitus without complications: Secondary | ICD-10-CM | POA: Insufficient documentation

## 2024-10-07 ENCOUNTER — Other Ambulatory Visit: Payer: Self-pay | Admitting: Allergy

## 2024-10-14 ENCOUNTER — Encounter: Payer: Self-pay | Admitting: Allergy

## 2024-10-14 MED ORDER — CETIRIZINE HCL 10 MG PO TABS: 10.0000 mg | ORAL_TABLET | Freq: Every day | ORAL | 0 refills | Status: AC

## 2024-11-02 DIAGNOSIS — R0789 Other chest pain: Secondary | ICD-10-CM | POA: Insufficient documentation

## 2024-11-02 DIAGNOSIS — F900 Attention-deficit hyperactivity disorder, predominantly inattentive type: Secondary | ICD-10-CM | POA: Insufficient documentation

## 2024-11-03 ENCOUNTER — Telehealth: Payer: Self-pay | Admitting: Cardiology

## 2024-11-03 DIAGNOSIS — R079 Chest pain, unspecified: Secondary | ICD-10-CM | POA: Insufficient documentation

## 2024-11-03 NOTE — Telephone Encounter (Signed)
 Patient wants a provider switch from Dr. Revankar to Dr. Krasowski.

## 2024-11-16 ENCOUNTER — Other Ambulatory Visit: Payer: Self-pay

## 2024-11-16 DIAGNOSIS — M1990 Unspecified osteoarthritis, unspecified site: Secondary | ICD-10-CM | POA: Insufficient documentation

## 2024-11-16 DIAGNOSIS — F32A Depression, unspecified: Secondary | ICD-10-CM | POA: Insufficient documentation

## 2024-11-16 DIAGNOSIS — Z21 Asymptomatic human immunodeficiency virus [HIV] infection status: Secondary | ICD-10-CM | POA: Insufficient documentation

## 2024-11-16 DIAGNOSIS — T7840XA Allergy, unspecified, initial encounter: Secondary | ICD-10-CM | POA: Insufficient documentation

## 2024-11-17 ENCOUNTER — Ambulatory Visit: Admitting: Cardiology

## 2024-11-17 ENCOUNTER — Encounter: Payer: Self-pay | Admitting: Cardiology

## 2024-11-17 VITALS — BP 136/90 | HR 78 | Ht 67.0 in | Wt 337.8 lb

## 2024-11-17 DIAGNOSIS — R079 Chest pain, unspecified: Secondary | ICD-10-CM | POA: Insufficient documentation

## 2024-11-17 DIAGNOSIS — R0609 Other forms of dyspnea: Secondary | ICD-10-CM | POA: Diagnosis not present

## 2024-11-17 DIAGNOSIS — R0600 Dyspnea, unspecified: Secondary | ICD-10-CM | POA: Diagnosis not present

## 2024-11-17 NOTE — Patient Instructions (Signed)

## 2024-11-17 NOTE — Progress Notes (Addendum)
 " Cardiology Office Note:    Date:  11/17/2024   ID:  Brenda Sawyer, DOB [birth year], MRN [id number]  PCP:  Trudy Wanda MATSU, FNP  Cardiologist:  Lamar Fitch, MD    Referring MD: Rumalda Eleanor RAMAN, NP   No chief complaint on file. I am scheduled to have Lap-Band surgery tomorrow  History of Present Illness:    Brenda Sawyer is a 33 y.o. female past medical history significant for morbid obesity, her BMI right now is 35, hypothyroidism, obstructive sleep apnea, diabetes, she was referred to our office in 2023 because of x-ray showing cardiomegaly, echocardiogram done thereafter showed normal function.  She is scheduled to have gastric band surgery tomorrow patient was sent to us  for evaluation for it.  She said that she can walk around slowly because of fatigue tiredness and shortness of breath, denies have any chest pain tightness squeezing pressure mid chest.  There is some swelling of lower extremities at evening time, but during physical exam only minimal.  She does not have paroxysmal nocturnal dyspnea.  She tried all different techniques trying to lose weight unsuccessfully therefore, finally she agreed to have gastric band surgery.  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   Bipolar depression (HCC)    Cardiomegaly    Chest pain 11/03/2024   Chronic midline thoracic back pain 10/10/2020   Depression    Elevated LFTs 11/06/2022   Gastroesophageal reflux disease 02/04/2023   HIV infection (HCC)    Hypothyroidism    Irritable bowel syndrome with diarrhea 02/04/2023   Mood disorder 01/03/2017   Morbid (severe) obesity with alveolar hypoventilation (HCC)    Neck strain, initial encounter 10/10/2020   Obstructive sleep apnea on CPAP 11/02/2022   OSA (obstructive sleep apnea)     Physical deconditioning 04/29/2024   Post-operative state 11/26/2022   PTSD (post-traumatic stress disorder)    Rectal bleeding 02/04/2023   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/2024   Scoliosis    Sleep apnea    Somnolence    Thyroid carcinoma (HCC) 02/13/2024   Tight chest 11/02/2024   Type 2 diabetes mellitus (HCC) 09/29/2024   Vitamin D deficiency 10/10/2020    Past Surgical History:  Procedure Laterality Date   BIOPSY  02/04/2023   Procedure: BIOPSY;  Surgeon: Charlanne Groom, MD;  Location: [facility];  Service: Gastroenterology;;   CHOLECYSTECTOMY     COLONOSCOPY WITH PROPOFOL  N/A 02/04/2023   Procedure: COLONOSCOPY WITH PROPOFOL ;  Surgeon: Charlanne Groom, MD;  Location: [facility];  Service: Gastroenterology;  Laterality: N/A;   ESOPHAGOGASTRODUODENOSCOPY (EGD) WITH PROPOFOL  N/A 02/04/2023   Procedure: ESOPHAGOGASTRODUODENOSCOPY (EGD) WITH PROPOFOL ;  Surgeon: Charlanne Groom, MD;  Location: [facility];  Service: Gastroenterology;  Laterality: N/A;   NO PAST SURGERIES     POLYPECTOMY  02/04/2023   Procedure: POLYPECTOMY;  Surgeon: Charlanne Groom, MD;  Location: [facility];  Service: Gastroenterology;;    Current Medications: Active Medications[1]   Allergies:   Molds & smuts, Other, Casein, Cinnamon, Sulfa antibiotics, Wound dressing adhesive, Grass pollen([id number]-o-r-t-swt vern), and Sulfamethoxazole-trimethoprim   Social History   Socioeconomic History   Marital status: Single    Spouse name: Not on file   Number of children: 1   Years of education: Not on file   Highest education level: Not on file  Occupational  History   Not on file  Tobacco Use   Smoking status: Former    Current packs/day: 0.00    Average packs/day: 0.5 packs/day for 15.0 years (7.5 ttl pk-yrs)    Types: Cigarettes    Start date: 2004    Quit date: 2019    Years since quitting: 7.0   Smokeless tobacco: Never  Vaping Use   Vaping status: Never Used  Substance and Sexual Activity   Alcohol use:  Never   Drug use: Never   Sexual activity: Yes  Other Topics Concern   Not on file  Social History Narrative   Not on file   Social Drivers of Health   Tobacco Use: Medium Risk (11/17/2024)   Patient History    Smoking Tobacco Use: Former    Smokeless Tobacco Use: Never    Passive Exposure: Not on file  Financial Resource Strain: Patient Declined (01/07/2024)   Received from [facility]   Overall Financial Resource Strain (CARDIA)    Difficulty of Paying Living Expenses: Patient declined  Food Insecurity: Low Risk (03/09/2024)   Received from [facility]   Epic    Within the past 12 months, you worried that your food would run out before you got money to buy more: Never true    Within the past 12 months, the food you bought just didn't last and you didn't have money to get more. : Never true  Transportation Needs: No Transportation Needs (03/09/2024)   Received from [facility]    In the past 12 months, has lack of reliable transportation kept you from medical appointments, meetings, work or from getting things needed for daily living? : No  Physical Activity: Not on file  Stress: Stress Concern Present (11/06/2022)   Received from [facility] - Occupational Stress Questionnaire    Feeling of Stress : Rather much  Social Connections: Not on file  Depression (PHQ2-9): Not on file  Alcohol Screen: Not on file  Housing: Low Risk (09/29/2024)   Received from [facility]   Epic    What is your living situation today?: I have a steady place to live    Think about the place you live. Do you have problems with any of the following? Choose all that apply:: None/None on this list  Utilities: Low Risk (03/09/2024)   Received from [facility]   Utilities    In the past 12 months has the electric, gas, oil, or water company threatened to shut off services in your home? : No  Health Literacy: Not on file     Family  History: The patient's family history includes Depression in her mother; Diabetes in her father; Heart disease in her maternal grandfather and maternal grandmother; Hypercholesterolemia in her father; Hypertension in her father; Hypothyroidism in her father; Lung cancer in her paternal grandfather. There is no history of Colon cancer, Colon polyps, Esophageal cancer, Rectal cancer, or Stomach cancer. ROS:   Please see the history of present illness.    All 14 point review of systems negative except as described per history of present illness  EKGs/Labs/Other Studies Reviewed:    EKG Interpretation Date/Time:  Tuesday November 17 2024 10:38:15 EST Ventricular Rate:  78 PR Interval:  138 QRS Duration:  80 QT Interval:  420 QTC Calculation: 478 R Axis:   62  Text Interpretation: Normal sinus rhythm Normal ECG When compared with ECG of 15-Jul-2023 16:23, PREVIOUS ECG IS PRESENT  Confirmed by Bernie Charleston [phone]) on 11/17/2024 10:45:00 AM    Recent Labs: No results found for requested labs within last 365 days.  Recent Lipid Panel No results found for: CHOL, TRIG, HDL, CHOLHDL, VLDL, LDLCALC, LDLDIRECT  Physical Exam:    VS:  BP (!) 136/90   Pulse 78   Ht 5' 7 (1.702 m)   Wt (!) 337 lb 12.8 oz (153.2 kg)   SpO2 93%   BMI 52.91 kg/m     Wt Readings from Last 3 Encounters:  11/17/24 (!) 337 lb 12.8 oz (153.2 kg)  07/15/23 (!) 340 lb (154.2 kg)  02/04/23 (!) 331 lb 5.6 oz (150.3 kg)     GEN:  Well nourished, well developed in no acute distress HEENT: Normal NECK: No JVD; No carotid bruits LYMPHATICS: No lymphadenopathy CARDIAC: RRR, no murmurs, no rubs, no gallops RESPIRATORY:  Clear to auscultation without rales, wheezing or rhonchi  ABDOMEN: Soft, non-tender, non-distended MUSCULOSKELETAL:  No edema; No deformity  SKIN: Warm and dry LOWER EXTREMITIES: no swelling NEUROLOGIC:  Alert and oriented x 3 PSYCHIATRIC:  Normal affect   ASSESSMENT:    1.  Chest pain of uncertain etiology   2. Dyspnea on exertion    PLAN:    In order of problems listed above:  Cardiovascular evaluation before gastric band surgery, evaluation is very difficult because of her morbid obesity.  I do not think we need to perform any ischemia workup, she does have some atypical chest pain not related to exercise, at the same time her exercise is very limited, because of her morbid obesity we have no good way to check her for coronary disease.  However, because of history of congestive heart failure.  Ask him to have echocardiogram done to make sure she does not have any significant cardiomyopathy.  Of course we need to take all precaution during the surgery and postsurgical time but she does have excellent team so hopefully she will do okay. Diabetes mellitus recent discovery 4 months ago the diagnosis has been established.  Follow-up by primary care physician. Cholesterol status unknown to me she said always cholesterol is good.  However if she is truly diabetic she intermittent cholesterol medication.  Hope is that when she had to have gastric bypass surgery that she will lose significant amount of weight and this way her diabetes will be better controlled. Dyspnea exertion multifactorial obviously morbid obesity played significant role here. If her echocardiogram showed preserved left ventricle ejection fraction I think she would be a reasonable candidate for surgery with of course elevated risk because of her morbid obesity  Echocardiogram has been done today in the hospital, so was somewhat suboptimal quality but sufficient for analysis, left ventricle ejection fraction appears to be normal, left atrium mildly enlarged no significant valve pathology, therefore, from cardiac standpoint review would be reasonable to for performing surgery tomorrow   Medication Adjustments/Labs and Tests Ordered: Current medicines are reviewed at length with the patient today.  Concerns  regarding medicines are outlined above.  Orders Placed This Encounter  Procedures   EKG 12-Lead   ECHOCARDIOGRAM COMPLETE   Medication changes: No orders of the defined types were placed in this encounter.   Signed, [place] DOROTHA Bernie, MD, [facility] 11/17/2024 10:56 AM    [facility]     [1]  Current Meds  Medication Sig   ACCU-CHEK GUIDE TEST test strip 1 strip.   Accu-Chek Softclix Lancets lancets SMARTSIG:1 Topical 5 Times Daily  ADDERALL 20 MG tablet Take 20 mg by mouth daily.   albuterol  (PROVENTIL ) (2.5 MG/3ML) 0.083% nebulizer solution Take 2.5 mg by nebulization.   Azelastine-Fluticasone  (DYMISTA ) 137-50 MCG/ACT SUSP Place 1 spray into the nose.   Blood Glucose Monitoring Suppl (ACCU-CHEK GUIDE) w/Device KIT SMARTSIG:2-3 Times Daily   Budeson-Glycopyrrol-Formoterol (BREZTRI  AEROSPHERE) 160-9-4.8 MCG/ACT AERO Inhale 2 puffs into the lungs daily.   cetirizine  (ZYRTEC ) 10 MG tablet Take 1 tablet (10 mg total) by mouth daily.   cromolyn  (OPTICROM ) 4 % ophthalmic solution Place 1 drop into both eyes.   DULERA 200-5 MCG/ACT AERO Inhale 2 puffs into the lungs.   EPINEPHrine  0.3 mg/0.3 mL IJ SOAJ injection Inject 0.3 mg into the muscle as needed.   fluticasone  (FLOVENT  HFA) 110 MCG/ACT inhaler Inhale two puffs twice daily during respiratory illness. Rinse mouth after use.   levothyroxine (SYNTHROID) 200 MCG tablet Take 200 mcg by mouth daily before breakfast.   metoCLOPramide (REGLAN) 10 MG tablet Take 10 mg by mouth.   montelukast  (SINGULAIR ) 10 MG tablet Take 10 mg by mouth at bedtime.   omeprazole (PRILOSEC) 40 MG capsule Take 40 mg by mouth daily.   ondansetron  (ZOFRAN -ODT) 8 MG disintegrating tablet Take 8 mg by mouth 3 (three) times daily.   Spacer/Aero-Holding Chambers (AEROCHAMBER MV) inhaler Use as instructed   VENTOLIN  HFA 108 (90 Base) MCG/ACT inhaler Inhale 2 puffs into the lungs every 4 (four) hours as needed for wheezing or shortness of breath.    "
